# Patient Record
Sex: Male | Born: 1950 | Race: Black or African American | Hispanic: No | Marital: Married | Smoking: Never smoker
Health system: Southern US, Community
[De-identification: ages and names within clinical notes are randomized; demographics above are authoritative.]

## PROBLEM LIST (undated history)

## (undated) DIAGNOSIS — M1990 Unspecified osteoarthritis, unspecified site: Secondary | ICD-10-CM

## (undated) DIAGNOSIS — I10 Essential (primary) hypertension: Secondary | ICD-10-CM

## (undated) DIAGNOSIS — R519 Headache, unspecified: Secondary | ICD-10-CM

## (undated) HISTORY — PX: TOE SURGERY: SHX1073

---

## 2002-08-24 ENCOUNTER — Encounter: Payer: Self-pay | Admitting: Family Medicine

## 2002-08-24 ENCOUNTER — Encounter: Admission: RE | Admit: 2002-08-24 | Discharge: 2002-08-24 | Payer: Self-pay | Admitting: Family Medicine

## 2017-01-23 ENCOUNTER — Other Ambulatory Visit: Payer: Self-pay | Admitting: Family Medicine

## 2017-01-23 DIAGNOSIS — R748 Abnormal levels of other serum enzymes: Secondary | ICD-10-CM

## 2017-02-07 ENCOUNTER — Ambulatory Visit
Admission: RE | Admit: 2017-02-07 | Discharge: 2017-02-07 | Disposition: A | Discharge status: Home / Self Care | Payer: Medicare Other | Source: Ambulatory Visit | Attending: Family Medicine | Admitting: Family Medicine

## 2017-02-07 DIAGNOSIS — R748 Abnormal levels of other serum enzymes: Secondary | ICD-10-CM

## 2017-02-07 IMAGING — US US ABDOMEN COMPLETE
1 series · 14 of 25 positions shown · non-contrast
Comparison: None in PACs

CLINICAL DATA: Elevated liver function studies

EXAM:
ABDOMEN ULTRASOUND COMPLETE

[Series 1: us abdomen complete · 0.17mm/px · 14 of 90 slices shown]
[im 1/90]
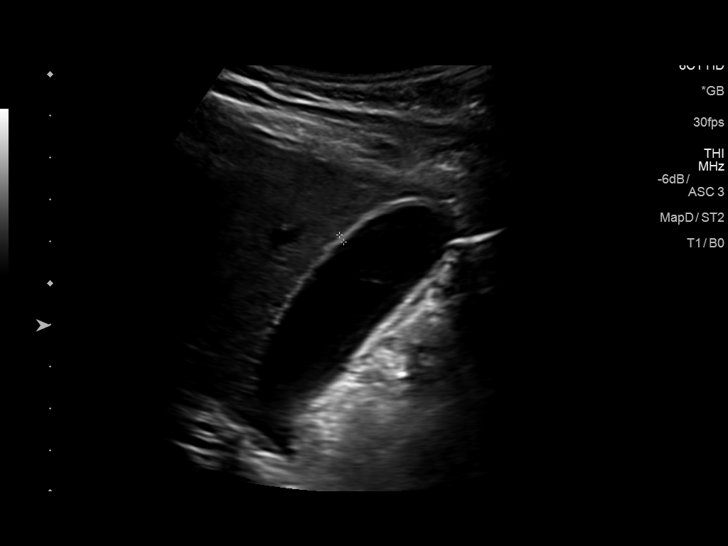
[im 8/90]
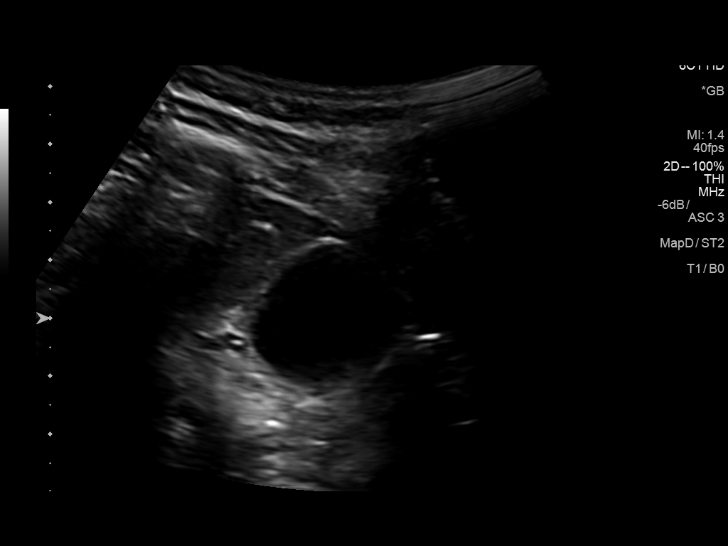
[im 15/90]
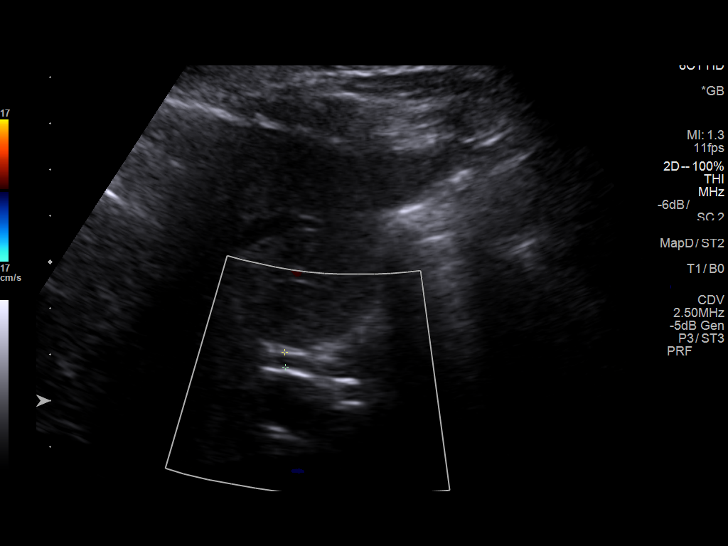
[im 23/90]
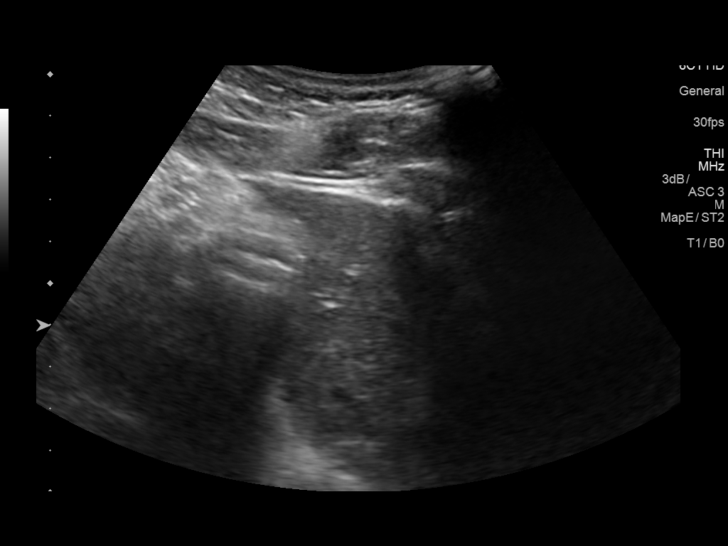
[im 30/90]
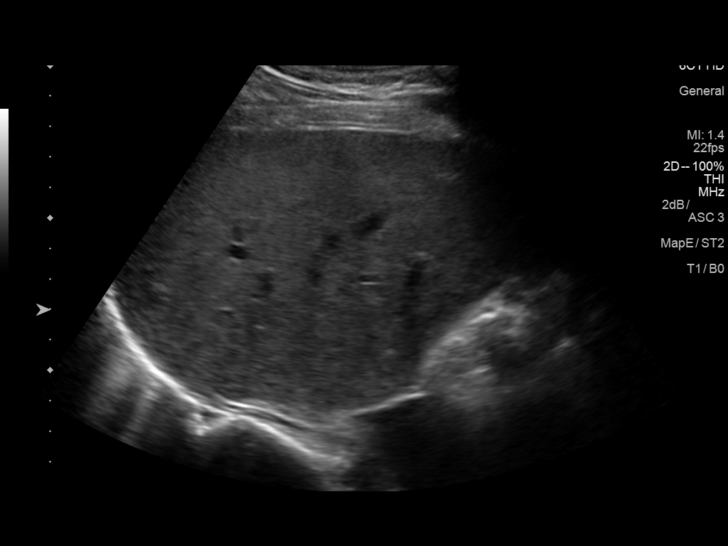
[im 34/90]
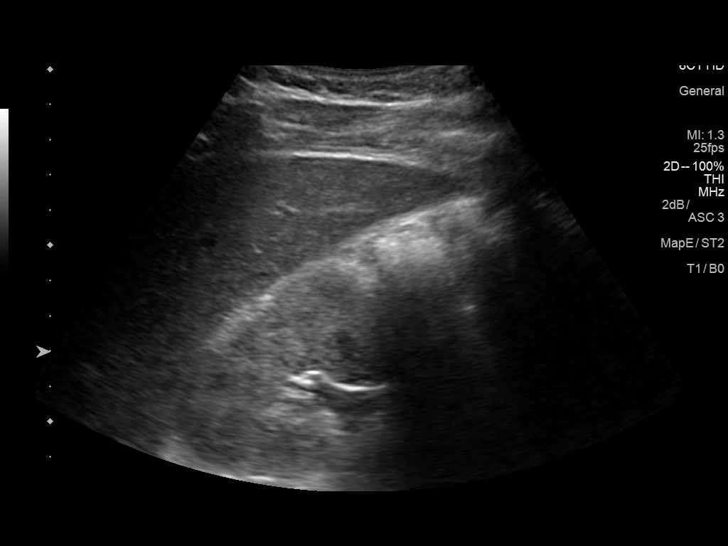
[im 41/90]
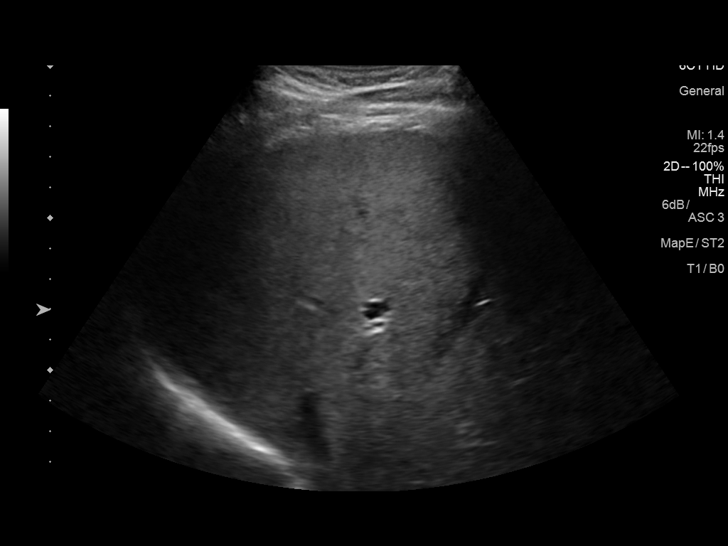
[im 49/90]
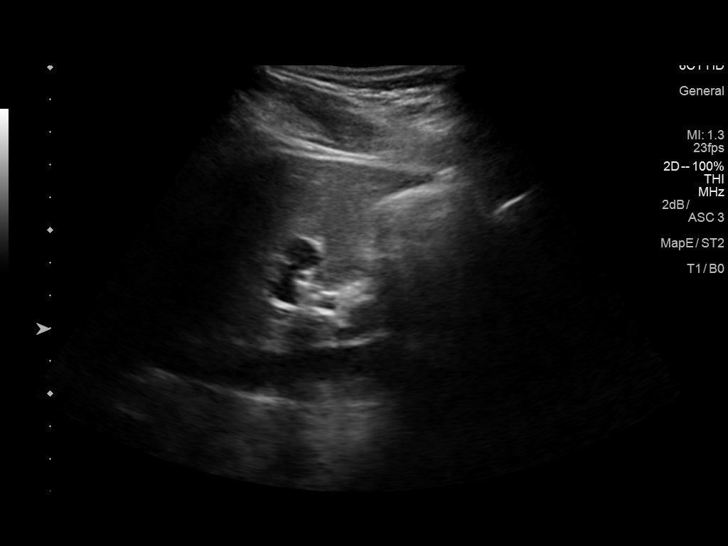
[im 56/90]
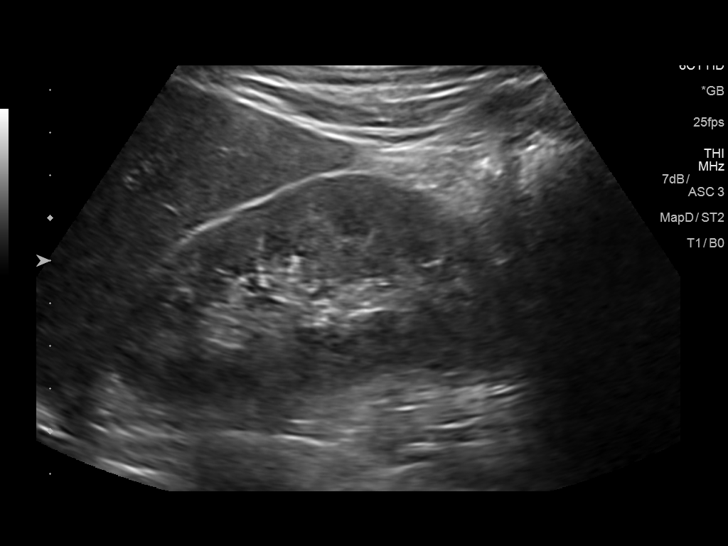
[im 60/90]
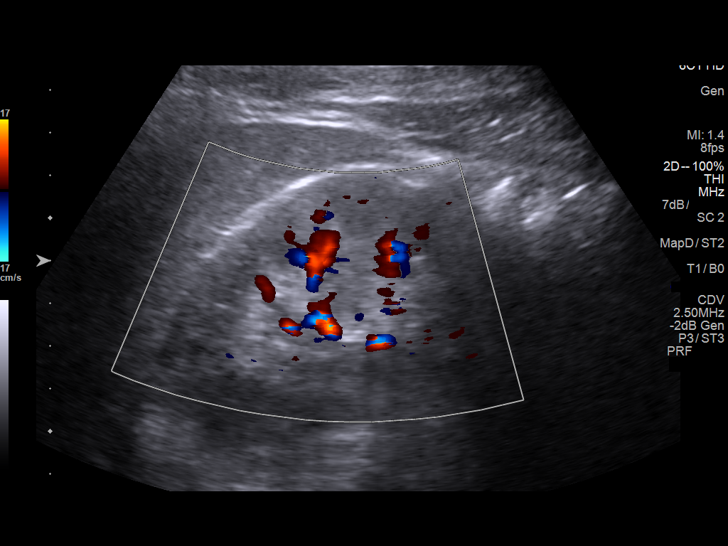
[im 67/90]
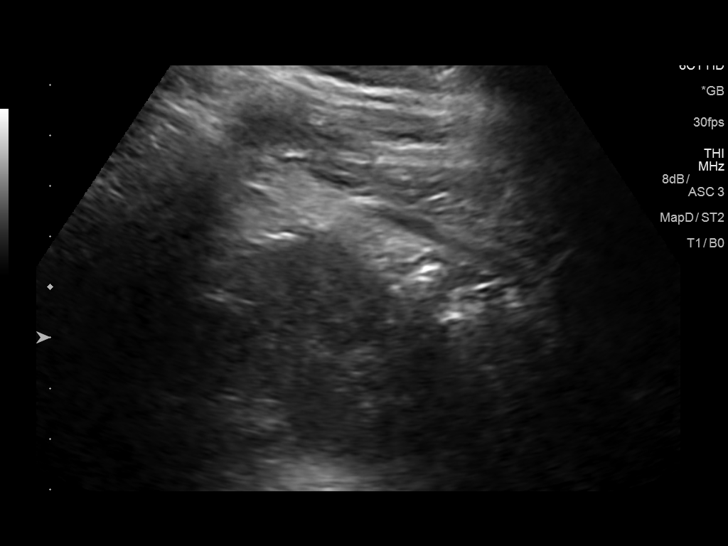
[im 75/90]
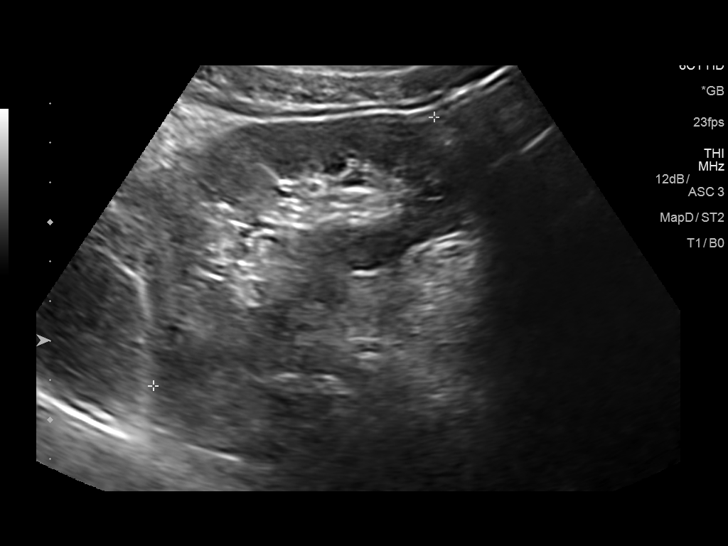
[im 82/90]
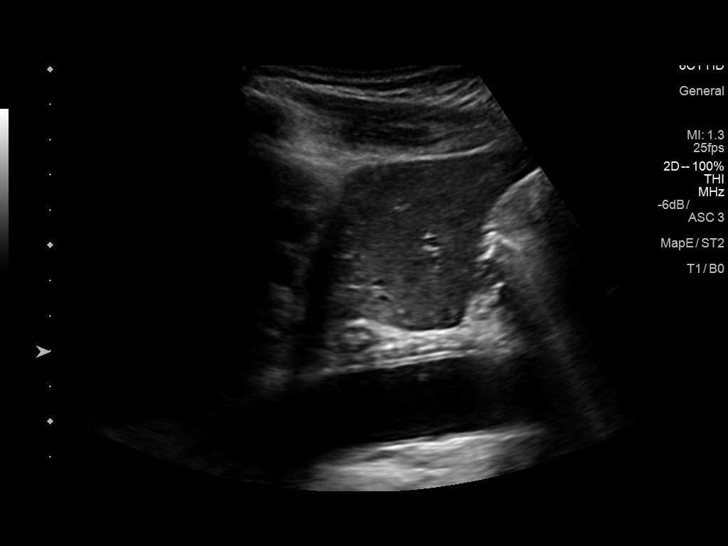
[im 90/90]
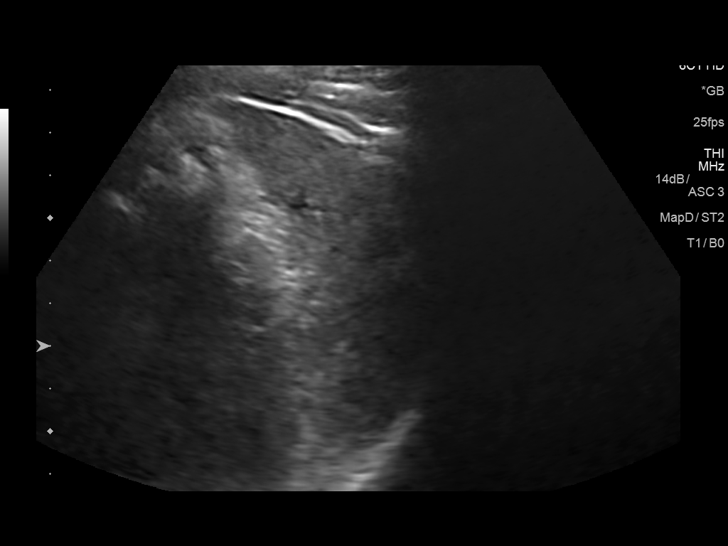

[14 of 25 positions shown; findings below may reference images not displayed]

FINDINGS: Gallbladder: No gallstones or wall thickening visualized. No
sonographic Murphy sign noted by sonographer.

Common bile duct: Diameter: 3.2 mm

Liver: No focal lesion identified. Within normal limits in
parenchymal echogenicity.

IVC: No abnormality visualized.

Pancreas: The pancreas is obscured by bowel gas.

Spleen: Size and appearance within normal limits.

Right Kidney: Length: 10.8 cm. Echogenicity within normal limits. No
mass or hydronephrosis visualized.

Left Kidney: Length: 10 cm. Echogenicity within normal limits. No
mass or hydronephrosis visualized.

Abdominal aorta: No aneurysm visualized.

Other findings: There is no ascites.
IMPRESSION: Normal appearance of the gallbladder, common bile duct, and liver.
Nonvisualization of the pancreas.

No acute intra-abdominal abnormality is observed.

## 2020-01-07 ENCOUNTER — Ambulatory Visit: Payer: Medicare Other | Attending: Internal Medicine

## 2020-01-07 DIAGNOSIS — Z23 Encounter for immunization: Secondary | ICD-10-CM | POA: Insufficient documentation

## 2020-01-07 NOTE — Progress Notes (Signed)
   Covid-19 Vaccination Clinic  Name:  Cristian Buck    MRN: [id number] DOB: May 16, 1951  01/07/2020  [name] was observed post Covid-19 immunization for 15 minutes without incidence. He was provided with Vaccine Information Sheet and instruction to access the V-Safe system.   [name] was instructed to call 911 with any severe reactions post vaccine: Marland Kitchen Difficulty breathing  . Swelling of your face and throat  . A fast heartbeat  . A bad rash all over your body  . Dizziness and weakness    Immunizations Administered    Name Date Dose VIS Date Route   Pfizer COVID-19 Vaccine 01/07/2020  9:16 AM 0.3 mL 10/23/2019 Intramuscular   Manufacturer: [employer], [employer]   Lot: [id number]   NDC: [address]-4

## 2020-02-02 ENCOUNTER — Ambulatory Visit: Payer: Medicare Other | Attending: Internal Medicine

## 2020-02-02 DIAGNOSIS — Z23 Encounter for immunization: Secondary | ICD-10-CM

## 2020-02-02 NOTE — Progress Notes (Signed)
   Covid-19 Vaccination Clinic  Name:  Cristian Buck    MRN: [id number] DOB: 1951-05-30  02/02/2020  Cristian Buck was observed post Covid-19 immunization for 15 minutes without incident. He was provided with Vaccine Information Sheet and instruction to access the V-Safe system.   Cristian Buck was instructed to call 911 with any severe reactions post vaccine: Marland Kitchen Difficulty breathing  . Swelling of face and throat  . A fast heartbeat  . A bad rash all over body  . Dizziness and weakness   Immunizations Administered    Name Date Dose VIS Date Route   Pfizer COVID-19 Vaccine 02/02/2020  8:36 AM 0.3 mL 10/23/2019 Intramuscular   Manufacturer: [employer], [employer]   Lot: [id number]   NDC: [address]-8

## 2021-03-06 ENCOUNTER — Other Ambulatory Visit: Payer: Self-pay | Admitting: Family Medicine

## 2021-03-06 DIAGNOSIS — I739 Peripheral vascular disease, unspecified: Secondary | ICD-10-CM

## 2021-03-14 ENCOUNTER — Ambulatory Visit
Admission: RE | Admit: 2021-03-14 | Discharge: 2021-03-14 | Disposition: A | Discharge status: Home / Self Care | Payer: Medicare Other | Source: Ambulatory Visit | Attending: Family Medicine | Admitting: Family Medicine

## 2021-03-14 DIAGNOSIS — I739 Peripheral vascular disease, unspecified: Secondary | ICD-10-CM

## 2022-08-17 ENCOUNTER — Other Ambulatory Visit: Payer: Self-pay | Admitting: Orthopaedic Surgery

## 2022-08-17 DIAGNOSIS — M545 Low back pain, unspecified: Secondary | ICD-10-CM

## 2022-08-17 DIAGNOSIS — M5116 Intervertebral disc disorders with radiculopathy, lumbar region: Secondary | ICD-10-CM

## 2022-08-17 DIAGNOSIS — G894 Chronic pain syndrome: Secondary | ICD-10-CM

## 2022-09-13 ENCOUNTER — Ambulatory Visit
Admission: RE | Admit: 2022-09-13 | Discharge: 2022-09-13 | Disposition: A | Discharge status: Home / Self Care | Payer: Medicare Other | Source: Ambulatory Visit | Attending: Orthopaedic Surgery | Admitting: Orthopaedic Surgery

## 2022-09-13 DIAGNOSIS — M5116 Intervertebral disc disorders with radiculopathy, lumbar region: Secondary | ICD-10-CM

## 2022-09-13 DIAGNOSIS — G894 Chronic pain syndrome: Secondary | ICD-10-CM

## 2022-09-13 DIAGNOSIS — M545 Low back pain, unspecified: Secondary | ICD-10-CM

## 2022-09-18 HISTORY — PX: COLONOSCOPY: SHX174

## 2022-10-16 ENCOUNTER — Other Ambulatory Visit: Payer: Self-pay | Admitting: Neurosurgery

## 2022-10-19 NOTE — Pre-Procedure Instructions (Signed)
Surgical Instructions    Your procedure is scheduled on Wednesday, October 24, 2022.  Report to [facility] Main Entrance "A" at 1:30  P.M., then check in with the Admitting office.  Call this number if you have problems the morning of surgery:  [phone]   If you have any questions prior to your surgery date call [phone]: Open Monday-Friday 8am-4pm If you experience any cold or flu symptoms such as cough, fever, chills, shortness of breath, etc. between now and your scheduled surgery, please notify us at the above number     Remember:  Do not eat after midnight the night before your surgery  You may drink clear liquids until 12:30 PM the day of your surgery.   Clear liquids allowed are: Water, Non-Citrus Juices (without pulp), Carbonated Beverages, Clear Tea, Black Coffee ONLY (NO MILK, CREAM OR POWDERED CREAMER of any kind), and Gatorade    Take these medicines the morning of surgery with A SIP OF WATER:   amLODipine (NORVASC)  rosuvastatin (CRESTOR)   As of today, STOP taking any Aspirin (unless otherwise instructed by your surgeon) Aleve, Naproxen, Ibuprofen, Motrin, Advil, Goody's, BC's, all herbal medications, fish oil, and all vitamins.  Follow your surgeon's instructions on when to stop Aspirin.  If no instructions were given by your surgeon then you will need to call the office to get those instructions.       Do not wear jewelry. Do not wear lotions, powders, cologne or deodorant. Do not shave 48 hours prior to surgery.  Men may shave face and neck. Do not bring valuables to the hospital. Do not wear nail polish  [place] is not responsible for any belongings or valuables.    Do NOT Smoke (Tobacco/Vaping)  24 hours prior to your procedure  If you use a CPAP at night, you may bring your mask for your overnight stay.   Contacts, glasses, hearing aids, dentures or partials may not be worn into surgery, please bring cases for these belongings   For patients  admitted to the hospital, discharge time will be determined by your treatment team.   Patients discharged the day of surgery will not be allowed to drive home, and someone needs to stay with them for 24 hours.   SURGICAL WAITING ROOM VISITATION Patients having surgery or a procedure may have no more than 2 support people in the waiting area - these visitors may rotate.   Children under the age of 43 must have an adult with them who is not the patient. If the patient needs to stay at the hospital during part of their recovery, the visitor guidelines for inpatient rooms apply. Pre-op nurse will coordinate an appropriate time for 1 support person to accompany patient in pre-op.  This support person may not rotate.   Please refer to [email or web address] for the visitor guidelines for Inpatients (after your surgery is over and you are in a regular room).    Special instructions:    Oral Hygiene is also important to reduce your risk of infection.  Remember - BRUSH YOUR TEETH THE MORNING OF SURGERY WITH YOUR REGULAR TOOTHPASTE   [facility]- Preparing For Surgery  Before surgery, you can play an important role. Because skin is not sterile, your skin needs to be as free of germs as possible. You can reduce the number of germs on your skin by washing with CHG (chlorahexidine gluconate) Soap before surgery.  CHG is an antiseptic cleaner which kills germs and bonds with the  skin to continue killing germs even after washing.     Please do not use if you have an allergy to CHG or antibacterial soaps. If your skin becomes reddened/irritated stop using the CHG.  Do not shave (including legs and underarms) for at least 48 hours prior to first CHG shower. It is OK to shave your face.  Please follow these instructions carefully.     Shower the NIGHT BEFORE SURGERY and the MORNING OF SURGERY with CHG Soap.   If you chose to wash your hair, wash your hair  first as usual with your normal shampoo. After you shampoo, rinse your hair and body thoroughly to remove the shampoo.  Then [employer] and genitals (private parts) with your normal soap and rinse thoroughly to remove soap.  After that Use CHG Soap as you would any other liquid soap. You can apply CHG directly to the skin and wash gently with a scrungie or a clean washcloth.   Apply the CHG Soap to your body ONLY FROM THE NECK DOWN.  Do not use on open wounds or open sores. Avoid contact with your eyes, ears, mouth and genitals (private parts). Wash Face and genitals (private parts)  with your normal soap.   Wash thoroughly, paying special attention to the area where your surgery will be performed.  Thoroughly rinse your body with warm water from the neck down.  DO NOT shower/wash with your normal soap after using and rinsing off the CHG Soap.  Pat yourself dry with a CLEAN TOWEL.  Wear CLEAN PAJAMAS to bed the night before surgery  Place CLEAN SHEETS on your bed the night before your surgery  DO NOT SLEEP WITH PETS.   Day of Surgery:  Take a shower with CHG soap. Wear Clean/Comfortable clothing the morning of surgery Do not apply any deodorants/lotions.   Remember to brush your teeth WITH YOUR REGULAR TOOTHPASTE.    If you received a COVID test during your pre-op visit, it is requested that you wear a mask when out in public, stay away from anyone that may not be feeling well, and notify your surgeon if you develop symptoms. If you have been in contact with anyone that has tested positive in the last 10 days, please notify your surgeon.    Please read over the following fact sheets that you were given.

## 2022-10-22 ENCOUNTER — Other Ambulatory Visit: Payer: Self-pay

## 2022-10-22 ENCOUNTER — Encounter (HOSPITAL_COMMUNITY): Payer: Self-pay

## 2022-10-22 ENCOUNTER — Encounter (HOSPITAL_COMMUNITY)
Admission: RE | Admit: 2022-10-22 | Discharge: 2022-10-22 | Disposition: A | Discharge status: Home / Self Care | Payer: Medicare Other | Source: Ambulatory Visit | Attending: Neurosurgery | Admitting: Neurosurgery

## 2022-10-22 VITALS — BP 145/83 | HR 81 | Temp 97.5°F | Resp 18 | Ht 72.0 in | Wt 169.4 lb

## 2022-10-22 DIAGNOSIS — Z01818 Encounter for other preprocedural examination: Secondary | ICD-10-CM | POA: Diagnosis not present

## 2022-10-22 DIAGNOSIS — I10 Essential (primary) hypertension: Secondary | ICD-10-CM | POA: Diagnosis not present

## 2022-10-22 HISTORY — DX: Headache, unspecified: R51.9

## 2022-10-22 HISTORY — DX: Essential (primary) hypertension: I10

## 2022-10-22 LAB — CBC
HCT: 41.1 % (ref 39.0–52.0)
Hemoglobin: 14.2 g/dL (ref 13.0–17.0)
MCH: 30.9 pg (ref 26.0–34.0)
MCHC: 34.5 g/dL (ref 30.0–36.0)
MCV: 89.5 fL (ref 80.0–100.0)
Platelets: 284 10*3/uL (ref 150–400)
RBC: 4.59 MIL/uL (ref 4.22–5.81)
RDW: 12.3 % (ref 11.5–15.5)
WBC: 5.2 10*3/uL (ref 4.0–10.5)
nRBC: 0 % (ref 0.0–0.2)

## 2022-10-22 LAB — BASIC METABOLIC PANEL
Anion gap: 7 (ref 5–15)
BUN: 6 mg/dL — ABNORMAL LOW (ref 8–23)
CO2: 28 mmol/L (ref 22–32)
Calcium: 9.3 mg/dL (ref 8.9–10.3)
Chloride: 103 mmol/L (ref 98–111)
Creatinine, Ser: 1 mg/dL (ref 0.61–1.24)
GFR, Estimated: >60 mL/min (ref >60)
Glucose, Bld: 95 mg/dL (ref 70–99)
Potassium: 3.7 mmol/L (ref 3.5–5.1)
Sodium: 138 mmol/L (ref 135–145)

## 2022-10-22 LAB — SURGICAL PCR SCREEN
MRSA, PCR: NEGATIVE
Staphylococcus aureus: NEGATIVE

## 2022-10-22 NOTE — Pre-Procedure Instructions (Signed)
Surgical Instructions    Your procedure is scheduled on Wednesday, October 24, 2022.  Report to [facility] Main Entrance "A" at 1:30  P.M., then check in with the Admitting office.  Call this number if you have problems the morning of surgery:  [phone]   If you have any questions prior to your surgery date call [phone]: Open Monday-Friday 8am-4pm If you experience any cold or flu symptoms such as cough, fever, chills, shortness of breath, etc. between now and your scheduled surgery, please notify us at the above number     Remember:  Do not eat or drink after midnight the night before your surgery     Take these medicines the morning of surgery with A SIP OF WATER:   amLODipine (NORVASC)  rosuvastatin (CRESTOR)   As of today, STOP taking any (unless otherwise instructed by your surgeon) Aleve, Naproxen, Ibuprofen, Motrin, Advil, Goody's, BC's, all herbal medications, fish oil, and all vitamins.  Follow your surgeon's instructions on when to stop Aspirin.  If no instructions were given by your surgeon then you will need to call the office to get those instructions.       [place] is not responsible for any belongings or valuables.    Do NOT Smoke (Tobacco/Vaping)  24 hours prior to your procedure  If you use a CPAP at night, you may bring your mask for your overnight stay.   Contacts, glasses, hearing aids, dentures or partials may not be worn into surgery, please bring cases for these belongings   For patients admitted to the hospital, discharge time will be determined by your treatment team.   Patients discharged the day of surgery will not be allowed to drive home, and someone needs to stay with them for 24 hours.   SURGICAL WAITING ROOM VISITATION Patients having surgery or a procedure may have no more than 2 support people in the waiting area - these visitors may rotate.   Children under the age of 27 must have an adult with them who is not the patient. If  the patient needs to stay at the hospital during part of their recovery, the visitor guidelines for inpatient rooms apply. Pre-op nurse will coordinate an appropriate time for 1 support person to accompany patient in pre-op.  This support person may not rotate.   Please refer to [email or web address] for the visitor guidelines for Inpatients (after your surgery is over and you are in a regular room).    Special instructions:    Oral Hygiene is also important to reduce your risk of infection.  Remember - BRUSH YOUR TEETH THE MORNING OF SURGERY WITH YOUR REGULAR TOOTHPASTE   [place]- Preparing For Surgery  Before surgery, you can play an important role. Because skin is not sterile, your skin needs to be as free of germs as possible. You can reduce the number of germs on your skin by washing with CHG (chlorahexidine gluconate) Soap before surgery.  CHG is an antiseptic cleaner which kills germs and bonds with the skin to continue killing germs even after washing.     Please do not use if you have an allergy to CHG or antibacterial soaps. If your skin becomes reddened/irritated stop using the CHG.  Do not shave (including legs and underarms) for at least 48 hours prior to first CHG shower. It is OK to shave your face.  Please follow these instructions carefully.     Shower the NIGHT BEFORE SURGERY and the MORNING OF SURGERY with  CHG Soap.   If you chose to wash your hair, wash your hair first as usual with your normal shampoo. After you shampoo, rinse your hair and body thoroughly to remove the shampoo.  Then [employer] and genitals (private parts) with your normal soap and rinse thoroughly to remove soap.  After that Use CHG Soap as you would any other liquid soap. You can apply CHG directly to the skin and wash gently with a scrungie or a clean washcloth.   Apply the CHG Soap to your body ONLY FROM THE NECK DOWN.  Do not use on open wounds  or open sores. Avoid contact with your eyes, ears, mouth and genitals (private parts). Wash Face and genitals (private parts)  with your normal soap.   Wash thoroughly, paying special attention to the area where your surgery will be performed.  Thoroughly rinse your body with warm water from the neck down.  DO NOT shower/wash with your normal soap after using and rinsing off the CHG Soap.  Pat yourself dry with a CLEAN TOWEL.  Wear CLEAN PAJAMAS to bed the night before surgery  Place CLEAN SHEETS on your bed the night before your surgery  DO NOT SLEEP WITH PETS.   Day of Surgery:  Take a shower with CHG soap. Wear Clean/Comfortable clothing the morning of surgery Do not wear jewelry. Do not wear lotions, powders, cologne or deodorant. Do not shave 48 hours prior to surgery.  Men may shave face and neck. Do not bring valuables to the hospital. Do not wear nail polish  Remember to brush your teeth WITH YOUR REGULAR TOOTHPASTE.    If you received a COVID test during your pre-op visit, it is requested that you wear a mask when out in public, stay away from anyone that may not be feeling well, and notify your surgeon if you develop symptoms. If you have been in contact with anyone that has tested positive in the last 10 days, please notify your surgeon.    Please read over the following fact sheets that you were given.

## 2022-10-22 NOTE — Progress Notes (Signed)
[facility] Cardi[facility] - denies  PPM/ICD - denies  Chest x-ray - N/A EKG - 10/22/2022  Stress Test - pt reports normal stress test "years ago" when he was playing sports.  ECHO - denies Cardiac Cath - denies  Sleep Study - denies  Fasting Blood Sugar - N/A  Last dose of GLP1 agonist-  N/A  Blood Thinner Instructions: N/A Aspirin Instructions: Last dose of Aspirin 10/12/22  ERAS Protcol - NPO   COVID TEST- N/A  Erie Noe with Dr. Lonie Peak office notified that pre-op orders need to be placed.   Anesthesia review: no  Patient denies shortness of breath, fever, cough and chest pain at PAT appointment   All instructions explained to the patient, with a verbal understanding of the material. Patient agrees to go over the instructions while at home for a better understanding. The opportunity to ask questions was provided.

## 2022-10-24 ENCOUNTER — Other Ambulatory Visit: Payer: Self-pay

## 2022-10-24 ENCOUNTER — Encounter (HOSPITAL_COMMUNITY): Payer: Self-pay | Admitting: Neurosurgery

## 2022-10-24 ENCOUNTER — Ambulatory Visit (HOSPITAL_COMMUNITY): Payer: Medicare Other | Admitting: Anesthesiology

## 2022-10-24 ENCOUNTER — Ambulatory Visit (HOSPITAL_BASED_OUTPATIENT_CLINIC_OR_DEPARTMENT_OTHER): Payer: Medicare Other | Admitting: Anesthesiology

## 2022-10-24 ENCOUNTER — Ambulatory Visit (HOSPITAL_COMMUNITY): Payer: Medicare Other

## 2022-10-24 ENCOUNTER — Encounter (HOSPITAL_COMMUNITY)
Admission: RE | Disposition: A | Discharge status: Home / Self Care | Payer: Self-pay | Source: Ambulatory Visit | Attending: Neurosurgery

## 2022-10-24 ENCOUNTER — Ambulatory Visit (HOSPITAL_COMMUNITY)
Admission: RE | Admit: 2022-10-24 | Discharge: 2022-10-25 | Disposition: A | Discharge status: Home / Self Care | Payer: Medicare Other | Source: Ambulatory Visit | Attending: Neurosurgery | Admitting: Neurosurgery

## 2022-10-24 DIAGNOSIS — M48062 Spinal stenosis, lumbar region with neurogenic claudication: Secondary | ICD-10-CM | POA: Diagnosis present

## 2022-10-24 DIAGNOSIS — Z79899 Other long term (current) drug therapy: Secondary | ICD-10-CM | POA: Insufficient documentation

## 2022-10-24 DIAGNOSIS — M5416 Radiculopathy, lumbar region: Secondary | ICD-10-CM | POA: Diagnosis not present

## 2022-10-24 DIAGNOSIS — I10 Essential (primary) hypertension: Secondary | ICD-10-CM | POA: Insufficient documentation

## 2022-10-24 DIAGNOSIS — M48061 Spinal stenosis, lumbar region without neurogenic claudication: Secondary | ICD-10-CM

## 2022-10-24 HISTORY — PX: LUMBAR LAMINECTOMY/DECOMPRESSION MICRODISCECTOMY: SHX5026

## 2022-10-24 SURGERY — LUMBAR LAMINECTOMY/DECOMPRESSION MICRODISCECTOMY 3 LEVELS
Anesthesia: General | Site: Spine Lumbar

## 2022-10-24 MED ORDER — LATANOPROST 0.005 % OP SOLN
1.0000 [drp] | Freq: Every day | OPHTHALMIC | Status: DC
  Administered 2022-10-24: 1 [drp] via OPHTHALMIC
  Filled 2022-10-24: qty 2.5

## 2022-10-24 MED ORDER — ROCURONIUM BROMIDE 10 MG/ML (PF) SYRINGE
PREFILLED_SYRINGE | INTRAVENOUS | Status: DC | PRN
  Administered 2022-10-24: 70 mg via INTRAVENOUS
  Administered 2022-10-24: 20 mg via INTRAVENOUS

## 2022-10-24 MED ORDER — SUGAMMADEX SODIUM 200 MG/2ML IV SOLN
INTRAVENOUS | Status: DC | PRN
  Administered 2022-10-24: 200 mg via INTRAVENOUS

## 2022-10-24 MED ORDER — PROPOFOL 10 MG/ML IV BOLUS
INTRAVENOUS | Status: AC
  Filled 2022-10-24: qty 20

## 2022-10-24 MED ORDER — PHENYLEPHRINE 80 MCG/ML (10ML) SYRINGE FOR IV PUSH (FOR BLOOD PRESSURE SUPPORT)
PREFILLED_SYRINGE | INTRAVENOUS | Status: DC | PRN
  Administered 2022-10-24 (×4): 80 ug via INTRAVENOUS
  Administered 2022-10-24: 160 ug via INTRAVENOUS
  Administered 2022-10-24 (×2): 80 ug via INTRAVENOUS

## 2022-10-24 MED ORDER — ASPIRIN 81 MG PO TBEC
81.0000 mg | DELAYED_RELEASE_TABLET | Freq: Every day | ORAL | Status: DC
  Administered 2022-10-25: 81 mg via ORAL
  Filled 2022-10-24: qty 1

## 2022-10-24 MED ORDER — LIDOCAINE 2% (20 MG/ML) 5 ML SYRINGE
INTRAMUSCULAR | Status: DC | PRN
  Administered 2022-10-24: 60 mg via INTRAVENOUS

## 2022-10-24 MED ORDER — ALUM & MAG HYDROXIDE-SIMETH 200-200-20 MG/5ML PO SUSP: 30.0000 mL | Freq: Four times a day (QID) | ORAL | Status: DC | PRN

## 2022-10-24 MED ORDER — 0.9 % SODIUM CHLORIDE (POUR BTL) OPTIME
TOPICAL | Status: DC | PRN
  Administered 2022-10-24: 1000 mL

## 2022-10-24 MED ORDER — CYCLOBENZAPRINE HCL 10 MG PO TABS
10.0000 mg | ORAL_TABLET | Freq: Three times a day (TID) | ORAL | Status: DC | PRN
  Filled 2022-10-24: qty 1

## 2022-10-24 MED ORDER — ONDANSETRON HCL 4 MG/2ML IJ SOLN
INTRAMUSCULAR | Status: DC | PRN
  Administered 2022-10-24: 4 mg via INTRAVENOUS

## 2022-10-24 MED ORDER — ADULT MULTIVITAMIN W/MINERALS CH
1.0000 | ORAL_TABLET | Freq: Every day | ORAL | Status: DC
  Administered 2022-10-25: 1 via ORAL
  Filled 2022-10-24: qty 1

## 2022-10-24 MED ORDER — CHLORHEXIDINE GLUCONATE 0.12 % MT SOLN
15.0000 mL | Freq: Once | OROMUCOSAL | Status: AC
  Administered 2022-10-24: 15 mL via OROMUCOSAL
  Filled 2022-10-24: qty 15

## 2022-10-24 MED ORDER — SODIUM CHLORIDE 0.9 % IV SOLN
250.0000 mL | INTRAVENOUS | Status: DC
  Administered 2022-10-24: 250 mL via INTRAVENOUS

## 2022-10-24 MED ORDER — SODIUM CHLORIDE 0.9% FLUSH: 3.0000 mL | INTRAVENOUS | Status: DC | PRN

## 2022-10-24 MED ORDER — LIDOCAINE-EPINEPHRINE 1 %-1:100000 IJ SOLN
INTRAMUSCULAR | Status: AC
  Filled 2022-10-24: qty 1

## 2022-10-24 MED ORDER — TELMISARTAN-HCTZ 80-12.5 MG PO TABS: 1.0000 | ORAL_TABLET | Freq: Every morning | ORAL | Status: DC

## 2022-10-24 MED ORDER — OXYCODONE HCL 5 MG/5ML PO SOLN: 5.0000 mg | Freq: Once | ORAL | Status: DC | PRN

## 2022-10-24 MED ORDER — FENTANYL CITRATE (PF) 250 MCG/5ML IJ SOLN
INTRAMUSCULAR | Status: AC
  Filled 2022-10-24: qty 5

## 2022-10-24 MED ORDER — CINNAMON 500 MG PO TABS: 500.0000 mg | ORAL_TABLET | Freq: Every day | ORAL | Status: DC

## 2022-10-24 MED ORDER — PHENYLEPHRINE HCL-NACL 20-0.9 MG/250ML-% IV SOLN
INTRAVENOUS | Status: DC | PRN
  Administered 2022-10-24: 40 ug/min via INTRAVENOUS

## 2022-10-24 MED ORDER — PANTOPRAZOLE SODIUM 40 MG IV SOLR
40.0000 mg | Freq: Every day | INTRAVENOUS | Status: DC
  Administered 2022-10-24: 40 mg via INTRAVENOUS
  Filled 2022-10-24: qty 10

## 2022-10-24 MED ORDER — PHENOL 1.4 % MT LIQD: 1.0000 | OROMUCOSAL | Status: DC | PRN

## 2022-10-24 MED ORDER — ROSUVASTATIN CALCIUM 5 MG PO TABS
10.0000 mg | ORAL_TABLET | Freq: Every morning | ORAL | Status: DC
  Administered 2022-10-25: 10 mg via ORAL
  Filled 2022-10-24: qty 2

## 2022-10-24 MED ORDER — AMISULPRIDE (ANTIEMETIC) 5 MG/2ML IV SOLN: 10.0000 mg | Freq: Once | INTRAVENOUS | Status: DC | PRN

## 2022-10-24 MED ORDER — THROMBIN 20000 UNITS EX SOLR: CUTANEOUS | Status: DC | PRN

## 2022-10-24 MED ORDER — ACETAMINOPHEN 500 MG PO TABS
1000.0000 mg | ORAL_TABLET | Freq: Once | ORAL | Status: AC
  Administered 2022-10-24: 1000 mg via ORAL
  Filled 2022-10-24: qty 2

## 2022-10-24 MED ORDER — OXYCODONE HCL 5 MG PO TABS: 5.0000 mg | ORAL_TABLET | Freq: Once | ORAL | Status: DC | PRN

## 2022-10-24 MED ORDER — PROPOFOL 10 MG/ML IV BOLUS
INTRAVENOUS | Status: DC | PRN
  Administered 2022-10-24: 130 mg via INTRAVENOUS

## 2022-10-24 MED ORDER — TURMERIC CURCUMIN PO CAPS: ORAL_CAPSULE | Freq: Every day | ORAL | Status: DC

## 2022-10-24 MED ORDER — ACETAMINOPHEN 650 MG RE SUPP: 650.0000 mg | RECTAL | Status: DC | PRN

## 2022-10-24 MED ORDER — CEFAZOLIN SODIUM-DEXTROSE 2-4 GM/100ML-% IV SOLN
2.0000 g | Freq: Three times a day (TID) | INTRAVENOUS | Status: AC
  Administered 2022-10-24 – 2022-10-25 (×2): 2 g via INTRAVENOUS
  Filled 2022-10-24 (×2): qty 100

## 2022-10-24 MED ORDER — ORAL CARE MOUTH RINSE: 15.0000 mL | Freq: Once | OROMUCOSAL | Status: AC

## 2022-10-24 MED ORDER — CEFAZOLIN SODIUM-DEXTROSE 2-4 GM/100ML-% IV SOLN
2.0000 g | INTRAVENOUS | Status: AC
  Administered 2022-10-24: 2 g via INTRAVENOUS
  Filled 2022-10-24: qty 100

## 2022-10-24 MED ORDER — ONDANSETRON HCL 4 MG/2ML IJ SOLN: 4.0000 mg | Freq: Four times a day (QID) | INTRAMUSCULAR | Status: DC | PRN

## 2022-10-24 MED ORDER — THROMBIN 20000 UNITS EX SOLR
CUTANEOUS | Status: AC
  Filled 2022-10-24: qty 20000

## 2022-10-24 MED ORDER — CHLORHEXIDINE GLUCONATE CLOTH 2 % EX PADS: 6.0000 | MEDICATED_PAD | Freq: Once | CUTANEOUS | Status: DC

## 2022-10-24 MED ORDER — THROMBIN 5000 UNITS EX SOLR
CUTANEOUS | Status: AC
  Filled 2022-10-24: qty 5000

## 2022-10-24 MED ORDER — IRBESARTAN 300 MG PO TABS
300.0000 mg | ORAL_TABLET | Freq: Every day | ORAL | Status: DC
  Administered 2022-10-25: 300 mg via ORAL
  Filled 2022-10-24: qty 1

## 2022-10-24 MED ORDER — HYDROCODONE-ACETAMINOPHEN 5-325 MG PO TABS: 2.0000 | ORAL_TABLET | ORAL | Status: DC | PRN

## 2022-10-24 MED ORDER — THROMBIN 5000 UNITS EX SOLR: OROMUCOSAL | Status: DC | PRN

## 2022-10-24 MED ORDER — THROMBIN 20000 UNITS EX KIT
PACK | CUTANEOUS | Status: AC
  Filled 2022-10-24: qty 1

## 2022-10-24 MED ORDER — HYDROCHLOROTHIAZIDE 12.5 MG PO TABS
12.5000 mg | ORAL_TABLET | Freq: Every day | ORAL | Status: DC
  Administered 2022-10-25: 12.5 mg via ORAL
  Filled 2022-10-24: qty 1

## 2022-10-24 MED ORDER — FENTANYL CITRATE (PF) 250 MCG/5ML IJ SOLN
INTRAMUSCULAR | Status: DC | PRN
  Administered 2022-10-24: 150 ug via INTRAVENOUS
  Administered 2022-10-24 (×2): 50 ug via INTRAVENOUS

## 2022-10-24 MED ORDER — BUPIVACAINE HCL (PF) 0.25 % IJ SOLN
INTRAMUSCULAR | Status: DC | PRN
  Administered 2022-10-24: 10 mL

## 2022-10-24 MED ORDER — ONDANSETRON HCL 4 MG PO TABS: 4.0000 mg | ORAL_TABLET | Freq: Four times a day (QID) | ORAL | Status: DC | PRN

## 2022-10-24 MED ORDER — DEXAMETHASONE SODIUM PHOSPHATE 10 MG/ML IJ SOLN
INTRAMUSCULAR | Status: DC | PRN
  Administered 2022-10-24: 10 mg via INTRAVENOUS

## 2022-10-24 MED ORDER — LACTATED RINGERS IV SOLN: INTRAVENOUS | Status: DC

## 2022-10-24 MED ORDER — ONDANSETRON HCL 4 MG/2ML IJ SOLN: 4.0000 mg | Freq: Once | INTRAMUSCULAR | Status: DC | PRN

## 2022-10-24 MED ORDER — BUPIVACAINE HCL (PF) 0.25 % IJ SOLN
INTRAMUSCULAR | Status: AC
  Filled 2022-10-24: qty 30

## 2022-10-24 MED ORDER — HYDROMORPHONE HCL 1 MG/ML IJ SOLN: 0.5000 mg | INTRAMUSCULAR | Status: DC | PRN

## 2022-10-24 MED ORDER — SODIUM CHLORIDE 0.9% FLUSH
3.0000 mL | Freq: Two times a day (BID) | INTRAVENOUS | Status: DC
  Administered 2022-10-24 – 2022-10-25 (×2): 3 mL via INTRAVENOUS

## 2022-10-24 MED ORDER — HYDROMORPHONE HCL 1 MG/ML IJ SOLN: 0.2500 mg | INTRAMUSCULAR | Status: DC | PRN

## 2022-10-24 MED ORDER — LIDOCAINE-EPINEPHRINE 1 %-1:100000 IJ SOLN
INTRAMUSCULAR | Status: DC | PRN
  Administered 2022-10-24: 10 mL

## 2022-10-24 MED ORDER — AMLODIPINE BESYLATE 5 MG PO TABS
5.0000 mg | ORAL_TABLET | Freq: Every morning | ORAL | Status: DC
  Administered 2022-10-25: 5 mg via ORAL
  Filled 2022-10-24: qty 1

## 2022-10-24 MED ORDER — ACETAMINOPHEN 325 MG PO TABS
650.0000 mg | ORAL_TABLET | ORAL | Status: DC | PRN
  Administered 2022-10-25: 650 mg via ORAL
  Filled 2022-10-24: qty 2

## 2022-10-24 MED ORDER — MENTHOL 3 MG MT LOZG: 1.0000 | LOZENGE | OROMUCOSAL | Status: DC | PRN

## 2022-10-24 SURGICAL SUPPLY — 49 items
BAG COUNTER SPONGE SURGICOUNT (BAG) ×1 IMPLANT
BAND RUBBER #18 3X1/16 STRL (MISCELLANEOUS) ×2 IMPLANT
BENZOIN TINCTURE PRP APPL 2/3 (GAUZE/BANDAGES/DRESSINGS) ×1 IMPLANT
BLADE CLIPPER SURG (BLADE) IMPLANT
BLADE SURG 11 STRL SS (BLADE) ×1 IMPLANT
BUR CUTTER 7.0 ROUND (BURR) ×1 IMPLANT
BUR MATCHSTICK NEURO 3.0 LAGG (BURR) ×1 IMPLANT
CANISTER SUCT 3000ML PPV (MISCELLANEOUS) ×1 IMPLANT
DERMABOND ADVANCED .7 DNX12 (GAUZE/BANDAGES/DRESSINGS) ×1 IMPLANT
DRAPE HALF SHEET 40X57 (DRAPES) IMPLANT
DRAPE LAPAROTOMY 100X72X124 (DRAPES) ×1 IMPLANT
DRAPE MICROSCOPE SLANT 54X150 (MISCELLANEOUS) ×1 IMPLANT
DRAPE SURG 17X23 STRL (DRAPES) ×1 IMPLANT
DRSG OPSITE 4X5.5 SM (GAUZE/BANDAGES/DRESSINGS) IMPLANT
DRSG OPSITE POSTOP 4X8 (GAUZE/BANDAGES/DRESSINGS) IMPLANT
DURAPREP 26ML APPLICATOR (WOUND CARE) ×1 IMPLANT
ELECT REM PT RETURN 9FT ADLT (ELECTROSURGICAL) ×1
ELECTRODE REM PT RTRN 9FT ADLT (ELECTROSURGICAL) ×1 IMPLANT
EVACUATOR 1/8 PVC DRAIN (DRAIN) IMPLANT
GAUZE 4X4 16PLY ~~LOC~~+RFID DBL (SPONGE) IMPLANT
GAUZE SPONGE 4X4 12PLY STRL (GAUZE/BANDAGES/DRESSINGS) ×1 IMPLANT
GLOVE BIO SURGEON STRL SZ7 (GLOVE) IMPLANT
GLOVE BIO SURGEON STRL SZ8 (GLOVE) ×1 IMPLANT
GLOVE BIOGEL PI IND STRL 7.0 (GLOVE) IMPLANT
GLOVE ECLIPSE 7.5 STRL STRAW (GLOVE) IMPLANT
GLOVE EXAM NITRILE XL STR (GLOVE) IMPLANT
GLOVE INDICATOR 8.5 STRL (GLOVE) ×2 IMPLANT
GOWN STRL REUS W/ TWL LRG LVL3 (GOWN DISPOSABLE) ×1 IMPLANT
GOWN STRL REUS W/ TWL XL LVL3 (GOWN DISPOSABLE) ×2 IMPLANT
GOWN STRL REUS W/TWL 2XL LVL3 (GOWN DISPOSABLE) IMPLANT
GOWN STRL REUS W/TWL LRG LVL3 (GOWN DISPOSABLE) ×1
GOWN STRL REUS W/TWL XL LVL3 (GOWN DISPOSABLE) ×2
KIT BASIN OR (CUSTOM PROCEDURE TRAY) ×1 IMPLANT
KIT TURNOVER KIT B (KITS) ×1 IMPLANT
NDL SPNL 22GX3.5 QUINCKE BK (NEEDLE) ×1 IMPLANT
NEEDLE HYPO 22GX1.5 SAFETY (NEEDLE) ×1 IMPLANT
NEEDLE SPNL 22GX3.5 QUINCKE BK (NEEDLE) ×1 IMPLANT
NS IRRIG 1000ML POUR BTL (IV SOLUTION) ×1 IMPLANT
PACK LAMINECTOMY NEURO (CUSTOM PROCEDURE TRAY) ×1 IMPLANT
SPIKE FLUID TRANSFER (MISCELLANEOUS) ×1 IMPLANT
SPONGE SURGIFOAM ABS GEL SZ50 (HEMOSTASIS) ×1 IMPLANT
STRIP CLOSURE SKIN 1/2X4 (GAUZE/BANDAGES/DRESSINGS) ×1 IMPLANT
SUT VIC AB 0 CT1 18XCR BRD8 (SUTURE) ×1 IMPLANT
SUT VIC AB 0 CT1 8-18 (SUTURE) ×1
SUT VIC AB 2-0 CT1 18 (SUTURE) ×1 IMPLANT
SUT VIC AB 4-0 PS2 27 (SUTURE) ×1 IMPLANT
TOWEL GREEN STERILE (TOWEL DISPOSABLE) ×1 IMPLANT
TOWEL GREEN STERILE FF (TOWEL DISPOSABLE) ×1 IMPLANT
WATER STERILE IRR 1000ML POUR (IV SOLUTION) ×1 IMPLANT

## 2022-10-24 NOTE — H&P (Signed)
Cristian Buck is an 71 y.o. male.   Chief Complaint: Back and bilateral hip and leg pain HPI: 71 year old gentleman with longstanding back bilateral hip and leg pain rating down L4-L5 nerve root pattern as well as anterior quads workup revealed severe spinal stenosis with thecal sac compression L2-3 L3-4 and L4-5.  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  Past Medical History:  Diagnosis Date   Headache    tension headaches   Hypertension     Past Surgical History:  Procedure Laterality Date   COLONOSCOPY  09/18/2022   TOE SURGERY Right    right little toe- shaved bone    History reviewed. No pertinent family history. Social History:  reports that he has never smoked. He has never used smokeless tobacco. He reports that he does not drink alcohol and does not use drugs.  Allergies: No Known Allergies  Medications Prior to Admission  Medication Sig Dispense Refill   amLODipine (NORVASC) 5 MG tablet Take 5 mg by mouth in the morning.     Cinnamon 500 MG TABS Take 500 mg by mouth daily.     latanoprost (XALATAN) 0.005 % ophthalmic solution Place 1 drop into both eyes at bedtime.     Multiple Vitamin (MULTIVITAMIN WITH MINERALS) TABS tablet Take 1 tablet by mouth daily.     rosuvastatin (CRESTOR) 10 MG tablet Take 10 mg by mouth in the morning.     telmisartan-hydrochlorothiazide (MICARDIS HCT) 80-12.5 MG tablet Take 1 tablet by mouth in the morning.     TURMERIC CURCUMIN PO Take 500 mg by mouth daily.     aspirin EC 81 MG tablet Take 81 mg by mouth daily. Swallow whole.      Results for orders placed or performed during the  hospital encounter of 10/22/22 (from the past 48 hour(s))  Basic metabolic panel per protocol     Status: Abnormal   Collection Time: 10/22/22  2:30 PM  Result Value Ref Range   Sodium 138 135 - 145 mmol/L   Potassium 3.7 3.5 - 5.1 mmol/L   Chloride 103 98 - 111 mmol/L   CO2 28 22 - 32 mmol/L   Glucose, Bld 95 70 - 99 mg/dL    Comment: Glucose reference range applies only to samples taken after fasting for at least 8 hours.   BUN 6 (L) 8 - 23 mg/dL   Creatinine, Ser 5.57 0.61 - 1.24 mg/dL   Calcium 9.3 8.9 - 32.2 mg/dL   GFR, Estimated >02 >54 mL/min    Comment: (NOTE) Calculated using the CKD-EPI Creatinine Equation (2021)    Anion gap 7 5 - 15    Comment: Performed at [facility], [address] [place], [place] [address]  CBC per protocol     Status: None   Collection Time: 10/22/22  2:30 PM  Result Value Ref Range   WBC 5.2 4.0 - 10.5 K/uL   RBC 4.59 4.22 - 5.81 MIL/uL   Hemoglobin 14.2 13.0 - 17.0 g/dL   HCT 37.6 28.3 - 15.1 %   MCV 89.5 80.0 - 100.0 fL   MCH 30.9 26.0 - 34.0 pg   MCHC 34.5 30.0 - 36.0 g/dL  RDW 12.3 11.5 - 15.5 %   Platelets 284 150 - 400 K/uL   nRBC 0.0 0.0 - 0.2 %    Comment: Performed at [facility], [address] [place], [place] [address]   No results found.  Review of Systems  Musculoskeletal:  Positive for neck pain.  Neurological:  Positive for numbness.    Blood pressure (!) 143/82, pulse 80, temperature 98.5 F (36.9 C), temperature source Oral, resp. rate 18, height 6' (1.829 m), weight 76.4 kg, SpO2 100 %. Physical Exam HENT:     Head: Normocephalic.     Nose: Nose normal.     Mouth/Throat:     Mouth: Mucous membranes are moist.  Eyes:     Pupils: Pupils are equal, round, and reactive to light.  Cardiovascular:     Rate and Rhythm: Normal rate.  Pulmonary:     Effort: Pulmonary effort is normal.  Abdominal:     General: Abdomen is flat.  Musculoskeletal:        General: Normal range of motion.  Skin:     General: Skin is warm.  Neurological:     Mental Status: He is alert.     Comments: Awake alert strength 5 out of 5 iliopsoas, quads, hamstrings, gastroc, into tibialis, and EHL.      Assessment/Plan 71 year old presents for decompressive lumbar laminectomy L2-L5.  Mariam Dollar, MD 10/24/2022, 1:59 PM

## 2022-10-24 NOTE — Anesthesia Preprocedure Evaluation (Signed)
Anesthesia Evaluation  Patient identified by MRN, date of birth, ID band Patient awake    Reviewed: Allergy & Precautions, NPO status , Patient's Chart, lab work & pertinent test results  History of Anesthesia Complications Negative for: history of anesthetic complications  Airway Mallampati: II  TM Distance: >3 FB Neck ROM: Full    Dental  (+) Dental Advisory Given   Pulmonary neg pulmonary ROS   Pulmonary exam normal        Cardiovascular hypertension, Pt. on medications Normal cardiovascular exam     Neuro/Psych negative neurological ROS     GI/Hepatic negative GI ROS, Neg liver ROS,,,  Endo/Other  negative endocrine ROS    Renal/GU negative Renal ROS  negative genitourinary   Musculoskeletal negative musculoskeletal ROS (+)    Abdominal   Peds  Hematology negative hematology ROS (+)   Anesthesia Other Findings   Reproductive/Obstetrics                              Anesthesia Physical Anesthesia Plan  ASA: 2  Anesthesia Plan: General   Post-op Pain Management: Tylenol PO (pre-op)* and Minimal or no pain anticipated   Induction: Intravenous  PONV Risk Score and Plan: 2 and Ondansetron, Dexamethasone, Treatment may vary due to age or medical condition and Midazolam  Airway Management Planned: Oral ETT  Additional Equipment: None  Intra-op Plan:   Post-operative Plan: Extubation in OR  Informed Consent: I have reviewed the patients History and Physical, chart, labs and discussed the procedure including the risks, benefits and alternatives for the proposed anesthesia with the patient or authorized representative who has indicated his/her understanding and acceptance.     Dental advisory given  Plan Discussed with:   Anesthesia Plan Comments:          Anesthesia Quick Evaluation

## 2022-10-24 NOTE — Transfer of Care (Signed)
Immediate Anesthesia Transfer of Care Note  Patient: Cristian Buck  Procedure(s) Performed: Sublaminar decompression - Lumbar Two-Three, Lumbar Three-Four, Lumbar Four-Five (Spine Lumbar)  Patient Location: PACU  Anesthesia Type:General  Level of Consciousness: awake and patient cooperative  Airway & Oxygen Therapy: Patient Spontanous Breathing and Patient connected to face mask oxygen  Post-op Assessment: Report given to RN, Post -op Vital signs reviewed and stable, and Patient moving all extremities  Post vital signs: Reviewed and stable  Last Vitals:  Vitals Value Taken Time  BP 113/62 10/24/22 1724  Temp    Pulse 79 10/24/22 1726  Resp 16 10/24/22 1728  SpO2 100 % 10/24/22 1726  Vitals shown include unvalidated device data.  Last Pain:  Vitals:   10/24/22 1321  TempSrc:   PainSc: 0-No pain      Patients Stated Pain Goal: 0 (10/24/22 1321)  Complications: No notable events documented.

## 2022-10-24 NOTE — Anesthesia Postprocedure Evaluation (Signed)
Anesthesia Post Note  Patient: Jayquan Bradsher  Procedure(s) Performed: Sublaminar decompression - Lumbar Two-Three, Lumbar Three-Four, Lumbar Four-Five (Spine Lumbar)     Patient location during evaluation: PACU Anesthesia Type: General Level of consciousness: awake Pain management: pain level controlled Vital Signs Assessment: post-procedure vital signs reviewed and stable Respiratory status: spontaneous breathing Cardiovascular status: stable Postop Assessment: no apparent nausea or vomiting Anesthetic complications: no   No notable events documented.  Last Vitals:  Vitals:   10/24/22 1730 10/24/22 1800  BP: 134/69   Pulse: 81   Resp: 18   Temp:  36.8 C  SpO2: 97%     Last Pain:  Vitals:   10/24/22 1800  TempSrc:   PainSc: 3                  Zonya Gudger

## 2022-10-24 NOTE — Anesthesia Procedure Notes (Signed)
Procedure Name: Intubation Date/Time: 10/24/2022 2:58 PM  Performed by: Janace Litten, CRNAPre-anesthesia Checklist: Patient identified, Emergency Drugs available, Suction available and Patient being monitored Patient Re-evaluated:Patient Re-evaluated prior to induction Oxygen Delivery Method: Circle System Utilized Preoxygenation: Pre-oxygenation with 100% oxygen Induction Type: IV induction Ventilation: Mask ventilation without difficulty Laryngoscope Size: Mac and 4 Grade View: Grade I Tube type: Oral Tube size: 7.5 mm Number of attempts: 1 Airway Equipment and Method: Stylet Placement Confirmation: ETT inserted through vocal cords under direct vision, positive ETCO2 and breath sounds checked- equal and bilateral Secured at: 23 cm Tube secured with: Tape Dental Injury: Teeth and Oropharynx as per pre-operative assessment

## 2022-10-24 NOTE — Op Note (Signed)
Preoperative diagnosis: Lumbar spinal stenosis L2-3, L3-4, L4-5 bilateral L3, L4, L5 radiculopathies and neurogenic claudication.  Postoperative diagnosis: Same.  Procedure: Decompressive lumbar laminectomies L2-3, L3-4, L4-5 with partial medial facetectomies and foraminotomies of the L2, L3, L4, and L5 nerve roots.  Surgeon: Donalee Citrin.  Assistant: Lisbeth Renshaw.  Assistant #2: Julien Girt.  Anesthesia: General.  EBL: 250.  HPI: 71 year old with respiratory and back bilateral hip and leg pain Difficulty walking only walk short distances before he had to stop and rest.  Workup revealed severe spinal stenosis primarily at L2-3 and L34.5.  Due to patient's progression of clinical syndrome imaging findings of a conservative treatment I recommend decompressive laminectomies at those 3 levels.  I extensively reviewed the risks and benefits of the operation with the patient as well as perioperative course expectations of outcome and alternatives to surgery and he understood and agreed to proceed forward.  Operative procedure: Patient was brought into the OR was induced under general anesthesia positioned prone Wilson frame his back was prepped and draped in routine sterile fashion.  Utilizing anatomical landmarks a midline incision was made centered over L2-L5 and after infiltration of 10 cc lidocaine via midline incision was made Bovie electrocautery was used to take down the subcutaneous tissue and subperiosteal dissection was carried of L2, L3, L4 and L5 bilaterally.  Interoperative x-ray confirmed defecation appropriate level.  So the spinous processes at L2-L3 and L4 were completely removed as well as the superior aspect the spinous process at L5 lamina was drilled down and thinned out central decompression was begun.  There was severe hourglass compression especially at the L2-3 and L3-4 interspaces so marching down the gutters they were under Bitton and partial medial facetectomies were  performed at L2-3, L3-4, L4-5.  Foraminotomies were then performed at the L2, L3, L4 and L5 nerve roots bilaterally.  At the end of decompression there was no further stenosis I was easily able to pass a coronary dilator out all the foramina to confirm adequate decompression the wound was then copiously irrigated meticulous hemostasis was maintained Gelfoam was ON top of the dura a medium Hemovac drain was placed and Marcaine was injected in the fascia the wound was then closed in layers with interrupted Vicryl and a running 4 subcuticular.  Dermabond benzoin Steri-Strips and a sterile dressing was applied patient recovery in stable condition.  At the end of the case all needle counts and sponge counts were correct.

## 2022-10-24 NOTE — Anesthesia Postprocedure Evaluation (Signed)
Anesthesia Post Note  Patient: Cristian Buck  Procedure(s) Performed: Sublaminar decompression - Lumbar Two-Three, Lumbar Three-Four, Lumbar Four-Five (Spine Lumbar)     Patient location during evaluation: PACU Anesthesia Type: General Level of consciousness: awake Pain management: pain level controlled Vital Signs Assessment: post-procedure vital signs reviewed and stable Respiratory status: spontaneous breathing Cardiovascular status: stable Postop Assessment: no apparent nausea or vomiting Anesthetic complications: no   No notable events documented.  Last Vitals:  Vitals:   10/24/22 1321 10/24/22 1725  BP: (!) 143/82   Pulse:    Resp:    Temp:  36.8 C  SpO2:      Last Pain:  Vitals:   10/24/22 1725  TempSrc:   PainSc: 0-No pain                 Zaniel Marineau

## 2022-10-25 ENCOUNTER — Encounter (HOSPITAL_COMMUNITY): Payer: Self-pay | Admitting: Neurosurgery

## 2022-10-25 DIAGNOSIS — M48062 Spinal stenosis, lumbar region with neurogenic claudication: Secondary | ICD-10-CM | POA: Diagnosis not present

## 2022-10-25 MED ORDER — HYDROCODONE-ACETAMINOPHEN 5-325 MG PO TABS: 2.0000 | ORAL_TABLET | ORAL | 0 refills | Status: DC | PRN

## 2022-10-25 NOTE — Plan of Care (Signed)
  Problem: Education: Goal: Ability to verbalize activity precautions or restrictions will improve Outcome: Progressing Goal: Knowledge of the prescribed therapeutic regimen will improve Outcome: Progressing Goal: Understanding of discharge needs will improve Outcome: Progressing   Problem: Activity: Goal: Ability to avoid complications of mobility impairment will improve Outcome: Progressing Goal: Ability to tolerate increased activity will improve Outcome: Progressing Goal: Will remain free from falls Outcome: Progressing   Problem: Bowel/Gastric: Goal: Gastrointestinal status for postoperative course will improve Outcome: Progressing   Problem: Pain Management: Goal: Pain level will decrease Outcome: Progressing   Problem: Skin Integrity: Goal: Will show signs of wound healing Outcome: Progressing

## 2022-10-25 NOTE — Discharge Summary (Signed)
  Physician Discharge Summary  Patient ID: Acie Custis MRN: [id number] DOB/AGE: January 16, 1951 71 y.o. Estimated body mass index is 22.84 kg/m as calculated from the following:   Height as of this encounter: 6' (1.829 m).   Weight as of this encounter: 76.4 kg.   Admit date: 10/24/2022 Discharge date: 10/25/2022  Admission Diagnoses: Lumbar spinal stenosis L2-L5  Discharge Diagnoses: Same Principal Problem:   Spinal stenosis of lumbar region   Discharged Condition: good  Hospital Course: Patient admitted to hospital underwent decompressive laminectomy L2-3, L3-4, L4-5 postoperative did very well when covering the floor on the floor was ambulating and voiding spontaneously tolerating regular diet and stable for discharge home with significant proven back and leg pain  Consults: Significant Diagnostic Studies: Treatments: Decompressive laminectomy L2-L5 Discharge Exam: Blood pressure 105/62, pulse 78, temperature 98.1 F (36.7 C), temperature source Oral, resp. rate 18, height 6' (1.829 m), weight 76.4 kg, SpO2 98 %. Strength 5-5 and clean dry and intact  Disposition: Home   Allergies as of 10/25/2022   No Known Allergies      Medication List     TAKE these medications    amLODipine 5 MG tablet Commonly known as: NORVASC Take 5 mg by mouth in the morning.   aspirin EC 81 MG tablet Take 81 mg by mouth daily. Swallow whole.   Cinnamon 500 MG Tabs Take 500 mg by mouth daily.   HYDROcodone-acetaminophen 5-325 MG tablet Commonly known as: NORCO/VICODIN Take 2 tablets by mouth every 4 (four) hours as needed for severe pain ((score 7 to 10)).   latanoprost 0.005 % ophthalmic solution Commonly known as: XALATAN Place 1 drop into both eyes at bedtime.   multivitamin with minerals Tabs tablet Take 1 tablet by mouth daily.   rosuvastatin 10 MG tablet Commonly known as: CRESTOR Take 10 mg by mouth in the morning.   telmisartan-hydrochlorothiazide 80-12.5 MG  tablet Commonly known as: MICARDIS HCT Take 1 tablet by mouth in the morning.   TURMERIC CURCUMIN PO Take 500 mg by mouth daily.         Signed: Mariam Dollar 10/25/2022, 8:04 AM

## 2022-10-25 NOTE — Evaluation (Signed)
Physical Therapy Evaluation and Discharge Patient Details Name: Cristian Buck MRN: [id number] DOB: 1950-12-19 Today's Date: 10/25/2022  History of Present Illness  Pt is 71 yo male who presents 10/24/22 for L2-3, L3-4, L4-5 laminectomies. PMH: HTN, headaches, neurogenic claudication  Clinical Impression  Patient evaluated by Physical Therapy with no further acute PT needs identified. All education has been completed and the patient has no further questions. Pt mobilizing independently however, he has LE weakness that is affecting his gait pattern. Recommend outpt PT after d/c to work on specific strengthening and balance.  See below for any follow-up Physical Therapy or equipment needs. PT is signing off. Thank you for this referral.        Recommendations for follow up therapy are one component of a multi-disciplinary discharge planning process, led by the attending physician.  Recommendations may be updated based on patient status, additional functional criteria and insurance authorization.  Follow Up Recommendations Follow physician's recommendations for discharge plan and follow up therapies (PT recommending outpt PT for strengthening and balance)      Assistance Recommended at Discharge PRN  Patient can return home with the following  Assistance with cooking/housework    Equipment Recommendations None recommended by PT  Recommendations for Other Services       Functional Status Assessment Patient has not had a recent decline in their functional status     Precautions / Restrictions Precautions Precautions: Back Precaution Booklet Issued: Yes (comment) Precaution Comments: reviewed precautions and proper posture as well as activity modifications Restrictions Weight Bearing Restrictions: No      Mobility  Bed Mobility Overal bed mobility: Modified Independent             General bed mobility comments: vc's for not twisting    Transfers Overall transfer level:  Modified independent Equipment used: None               General transfer comment: safe with transfers    Ambulation/Gait Ambulation/Gait assistance: Modified independent (Device/Increase time) Gait Distance (Feet): 250 Feet Assistive device: None Gait Pattern/deviations: Antalgic, Trunk flexed Gait velocity: WFL Gait velocity interpretation: >4.37 ft/sec, indicative of normal walking speed   General Gait Details: pt with weakness noted hip extensors and mildly shortened hip flexors, position is not fixed as pt is able to lie flat on his back on bed but in standing flexes at hips. Worked on correction but will need some strengthening to be able to maintain  Stairs Stairs: Yes Stairs assistance: Modified independent (Device/Increase time) Stair Management: One rail Left, Alternating pattern, Forwards Number of Stairs: 5 General stair comments: uses momentum when stepping up with LLE first  Wheelchair Mobility    Modified Rankin (Stroke Patients Only)       Balance Overall balance assessment: Mild deficits observed, not formally tested                                           Pertinent Vitals/Pain Pain Assessment Pain Assessment: Faces Faces Pain Scale: Hurts a little bit Pain Location: back Pain Descriptors / Indicators: Operative site guarding Pain Intervention(s): Limited activity within patient's tolerance    Home Living Family/patient expects to be discharged to:: Private residence Living Arrangements: Spouse/significant other Available Help at Discharge: Available 24 hours/day;Family Type of Home: House Home Access: Stairs to enter Entrance Stairs-Rails: Right Entrance Stairs-Number of Steps: 4   Home Layout: One level  Home Equipment: None      Prior Function Prior Level of Function : Independent/Modified Independent             Mobility Comments: ambulated with a limp and stooped posture per his reports ADLs Comments:  independent     Hand Dominance        Extremity/Trunk Assessment   Upper Extremity Assessment Upper Extremity Assessment: Overall WFL for tasks assessed    Lower Extremity Assessment Lower Extremity Assessment: Generalized weakness (from nerve compression)    Cervical / Trunk Assessment Cervical / Trunk Assessment: Kyphotic  Communication   Communication: No difficulties  Cognition Arousal/Alertness: Awake/alert Behavior During Therapy: WFL for tasks assessed/performed Overall Cognitive Status: Within Functional Limits for tasks assessed                                          General Comments      Exercises     Assessment/Plan    PT Assessment All further PT needs can be met in the next venue of care  PT Problem List Decreased strength;Decreased balance;Pain       PT Treatment Interventions      PT Goals (Current goals can be found in the Care Plan section)  Acute Rehab PT Goals Patient Stated Goal: return home, stand up straight PT Goal Formulation: All assessment and education complete, DC therapy    Frequency       Co-evaluation               AM-PAC PT "6 Clicks" Mobility  Outcome Measure Help needed turning from your back to your side while in a flat bed without using bedrails?: None Help needed moving from lying on your back to sitting on the side of a flat bed without using bedrails?: None Help needed moving to and from a bed to a chair (including a wheelchair)?: None Help needed standing up from a chair using your arms (e.g., wheelchair or bedside chair)?: None Help needed to walk in hospital room?: None Help needed climbing 3-5 steps with a railing? : None 6 Click Score: 24    End of Session   Activity Tolerance: Patient tolerated treatment well Patient left: in chair;with call bell/phone within reach Nurse Communication: Mobility status PT Visit Diagnosis: Unsteadiness on feet (R26.81);Pain;Difficulty in walking,  not elsewhere classified (R26.2) Pain - part of body:  (back)    Time: 5638-9373 PT Time Calculation (min) (ACUTE ONLY): 28 min   Charges:   PT Evaluation $PT Eval Moderate Complexity: 1 Mod PT Treatments $Gait Training: 8-22 mins        Leighton Roach, PT  Acute Rehab Services Secure chat preferred Office [place] 10/25/2022, 12:13 PM

## 2024-11-30 ENCOUNTER — Other Ambulatory Visit: Payer: Self-pay | Admitting: Neurosurgery

## 2024-12-16 ENCOUNTER — Encounter (HOSPITAL_COMMUNITY): Payer: Self-pay

## 2024-12-16 ENCOUNTER — Inpatient Hospital Stay (HOSPITAL_COMMUNITY): Admission: RE | Admit: 2024-12-16 | Discharge: 2024-12-16 | Attending: Neurosurgery

## 2024-12-16 ENCOUNTER — Other Ambulatory Visit: Payer: Self-pay

## 2024-12-16 VITALS — BP 140/83 | HR 74 | Temp 98.8°F | Resp 18 | Ht 69.0 in | Wt 168.6 lb

## 2024-12-16 DIAGNOSIS — Z01818 Encounter for other preprocedural examination: Secondary | ICD-10-CM | POA: Insufficient documentation

## 2024-12-16 HISTORY — DX: Unspecified osteoarthritis, unspecified site: M19.90

## 2024-12-16 LAB — CBC
HCT: 42.6 % (ref 39.0–52.0)
Hemoglobin: 15.3 g/dL (ref 13.0–17.0)
MCH: 31.7 pg (ref 26.0–34.0)
MCHC: 35.9 g/dL (ref 30.0–36.0)
MCV: 88.4 fL (ref 80.0–100.0)
Platelets: 229 10*3/uL (ref 150–400)
RBC: 4.82 MIL/uL (ref 4.22–5.81)
RDW: 11.9 % (ref 11.5–15.5)
WBC: 6.2 10*3/uL (ref 4.0–10.5)
nRBC: 0 % (ref 0.0–0.2)

## 2024-12-16 LAB — TYPE AND SCREEN
ABO/RH(D): O POS
Antibody Screen: NEGATIVE

## 2024-12-16 LAB — SURGICAL PCR SCREEN
MRSA, PCR: NEGATIVE
Staphylococcus aureus: NEGATIVE

## 2024-12-16 NOTE — Progress Notes (Addendum)
 PCP - Dr. Alm Rav Cardiologist - denies  PPM/ICD - denies Device Orders - na Rep Notified - na  Chest x-ray -  EKG - PAT, 12/16/2024 Stress Test -  ECHO -  Cardiac Cath -   Sleep Study - denies CPAP - na  Non-diabetic  Blood Thinner Instructions: denies Aspirin  Instructions:states last dose 12/10/2024  ERAS Protcol - NPO  Anesthesia review: No  Patient denies shortness of breath, fever, cough and chest pain at PAT appointment   All instructions explained to the patient, with a verbal understanding of the material. Patient agrees to go over the instructions while at home for a better understanding. Patient also instructed to self quarantine after being tested for COVID-19. The opportunity to ask questions was provided.

## 2024-12-18 ENCOUNTER — Other Ambulatory Visit: Payer: Self-pay

## 2024-12-18 ENCOUNTER — Encounter (HOSPITAL_COMMUNITY): Payer: Self-pay | Admitting: Neurosurgery

## 2024-12-18 ENCOUNTER — Ambulatory Visit (HOSPITAL_COMMUNITY)

## 2024-12-18 ENCOUNTER — Ambulatory Visit (HOSPITAL_COMMUNITY): Admission: RE | Admit: 2024-12-18 | Source: Home / Self Care | Admitting: Neurosurgery

## 2024-12-18 ENCOUNTER — Encounter (HOSPITAL_COMMUNITY): Admission: RE | Payer: Self-pay | Source: Home / Self Care

## 2024-12-18 DIAGNOSIS — M4712 Other spondylosis with myelopathy, cervical region: Secondary | ICD-10-CM | POA: Diagnosis present

## 2024-12-18 LAB — BASIC METABOLIC PANEL WITH GFR
Anion gap: 8 (ref 5–15)
BUN: 15 mg/dL (ref 8–23)
CO2: 28 mmol/L (ref 22–32)
Calcium: 9.3 mg/dL (ref 8.9–10.3)
Chloride: 101 mmol/L (ref 98–111)
Creatinine, Ser: 1.05 mg/dL (ref 0.61–1.24)
GFR, Estimated: >60 mL/min
Glucose, Bld: 108 mg/dL — ABNORMAL HIGH (ref 70–99)
Potassium: 4 mmol/L (ref 3.5–5.1)
Sodium: 138 mmol/L (ref 135–145)

## 2024-12-18 LAB — POCT I-STAT 7, (LYTES, BLD GAS, ICA,H+H)
Acid-base deficit: 1 mmol/L (ref 0.0–2.0)
Bicarbonate: 22.2 mmol/L (ref 20.0–28.0)
Calcium, Ion: 1.14 mmol/L — ABNORMAL LOW (ref 1.15–1.40)
HCT: 34 % — ABNORMAL LOW (ref 39.0–52.0)
Hemoglobin: 11.6 g/dL — ABNORMAL LOW (ref 13.0–17.0)
O2 Saturation: 100 %
Patient temperature: 36
Potassium: 3.5 mmol/L (ref 3.5–5.1)
Sodium: 139 mmol/L (ref 135–145)
TCO2: 23 mmol/L (ref 22–32)
pCO2 arterial: 31.7 mmHg — ABNORMAL LOW (ref 32–48)
pH, Arterial: 7.449 (ref 7.35–7.45)
pO2, Arterial: 299 mmHg — ABNORMAL HIGH (ref 83–108)

## 2024-12-18 LAB — ABO/RH: ABO/RH(D): O POS

## 2024-12-18 MED ORDER — MIDAZOLAM HCL (PF) 2 MG/2ML IJ SOLN
INTRAMUSCULAR | Status: DC | PRN
  Administered 2024-12-18: 2 mg via INTRAVENOUS

## 2024-12-18 MED ORDER — ACETAMINOPHEN 10 MG/ML IV SOLN
1000.0000 mg | Freq: Once | INTRAVENOUS | Status: DC | PRN
  Administered 2024-12-18: 1000 mg via INTRAVENOUS

## 2024-12-18 MED ORDER — ROCURONIUM BROMIDE 10 MG/ML (PF) SYRINGE
PREFILLED_SYRINGE | INTRAVENOUS | Status: DC | PRN
  Administered 2024-12-18 (×3): 30 mg via INTRAVENOUS
  Administered 2024-12-18: 70 mg via INTRAVENOUS
  Administered 2024-12-18: 30 mg via INTRAVENOUS

## 2024-12-18 MED ORDER — ALBUMIN HUMAN 5 % IV SOLN: INTRAVENOUS | Status: DC | PRN

## 2024-12-18 MED ORDER — SODIUM CHLORIDE 0.9% FLUSH
3.0000 mL | Freq: Two times a day (BID) | INTRAVENOUS | Status: AC
  Administered 2024-12-18 (×2): 3 mL via INTRAVENOUS

## 2024-12-18 MED ORDER — PHENOL 1.4 % MT LIQD: 1.0000 | OROMUCOSAL | Status: AC | PRN

## 2024-12-18 MED ORDER — TELMISARTAN-HCTZ 80-12.5 MG PO TABS: 1.0000 | ORAL_TABLET | Freq: Every morning | ORAL | Status: DC

## 2024-12-18 MED ORDER — LATANOPROST 0.005 % OP SOLN
1.0000 [drp] | Freq: Every day | OPHTHALMIC | Status: AC
  Administered 2024-12-18: 1 [drp] via OPHTHALMIC
  Filled 2024-12-18: qty 2.5

## 2024-12-18 MED ORDER — LACTATED RINGERS IV SOLN: INTRAVENOUS | Status: DC

## 2024-12-18 MED ORDER — THROMBIN 20000 UNITS EX SOLR: CUTANEOUS | Status: DC | PRN

## 2024-12-18 MED ORDER — HYDROMORPHONE HCL 1 MG/ML IJ SOLN
0.2500 mg | INTRAMUSCULAR | Status: DC | PRN
  Administered 2024-12-18: 0.5 mg via INTRAVENOUS

## 2024-12-18 MED ORDER — HYDROCODONE-ACETAMINOPHEN 5-325 MG PO TABS
2.0000 | ORAL_TABLET | ORAL | Status: AC | PRN
  Administered 2024-12-18: 2 via ORAL
  Filled 2024-12-18: qty 2

## 2024-12-18 MED ORDER — MENTHOL 3 MG MT LOZG: 1.0000 | LOZENGE | OROMUCOSAL | Status: AC | PRN

## 2024-12-18 MED ORDER — BUPIVACAINE LIPOSOME 1.3 % IJ SUSP
INTRAMUSCULAR | Status: AC
  Filled 2024-12-18: qty 20

## 2024-12-18 MED ORDER — IRBESARTAN 150 MG PO TABS: 300.0000 mg | ORAL_TABLET | Freq: Every day | ORAL | Status: AC

## 2024-12-18 MED ORDER — PANTOPRAZOLE SODIUM 40 MG IV SOLR: 40.0000 mg | Freq: Every day | INTRAVENOUS | Status: DC

## 2024-12-18 MED ORDER — AMLODIPINE BESYLATE 5 MG PO TABS: 5.0000 mg | ORAL_TABLET | Freq: Every morning | ORAL | Status: AC

## 2024-12-18 MED ORDER — TURMERIC CURCUMIN PO CAPS: ORAL_CAPSULE | Freq: Every day | ORAL | Status: DC

## 2024-12-18 MED ORDER — MIDAZOLAM HCL 2 MG/2ML IJ SOLN
INTRAMUSCULAR | Status: AC
  Filled 2024-12-18: qty 2

## 2024-12-18 MED ORDER — OXYCODONE HCL 5 MG/5ML PO SOLN: 5.0000 mg | Freq: Once | ORAL | Status: DC | PRN

## 2024-12-18 MED ORDER — BUPIVACAINE-EPINEPHRINE (PF) 0.25% -1:200000 IJ SOLN
INTRAMUSCULAR | Status: AC
  Filled 2024-12-18: qty 30

## 2024-12-18 MED ORDER — THROMBIN 5000 UNITS EX KIT
PACK | CUTANEOUS | Status: AC
  Filled 2024-12-18: qty 1

## 2024-12-18 MED ORDER — HYDROCHLOROTHIAZIDE 12.5 MG PO TABS: 12.5000 mg | ORAL_TABLET | Freq: Every day | ORAL | Status: AC

## 2024-12-18 MED ORDER — CHLORHEXIDINE GLUCONATE 0.12 % MT SOLN
15.0000 mL | Freq: Once | OROMUCOSAL | Status: AC
  Administered 2024-12-18: 15 mL via OROMUCOSAL
  Filled 2024-12-18: qty 15

## 2024-12-18 MED ORDER — PHENYLEPHRINE 80 MCG/ML (10ML) SYRINGE FOR IV PUSH (FOR BLOOD PRESSURE SUPPORT)
PREFILLED_SYRINGE | INTRAVENOUS | Status: DC | PRN
  Administered 2024-12-18: 160 ug via INTRAVENOUS
  Administered 2024-12-18 (×2): 80 ug via INTRAVENOUS
  Administered 2024-12-18 (×2): 160 ug via INTRAVENOUS

## 2024-12-18 MED ORDER — SODIUM CHLORIDE 0.9% FLUSH: 3.0000 mL | INTRAVENOUS | Status: AC | PRN

## 2024-12-18 MED ORDER — LIDOCAINE 2% (20 MG/ML) 5 ML SYRINGE
INTRAMUSCULAR | Status: DC | PRN
  Administered 2024-12-18: 100 mg via INTRAVENOUS

## 2024-12-18 MED ORDER — HYDROMORPHONE HCL 1 MG/ML IJ SOLN
INTRAMUSCULAR | Status: AC
  Filled 2024-12-18: qty 1

## 2024-12-18 MED ORDER — ONDANSETRON HCL 4 MG/2ML IJ SOLN
INTRAMUSCULAR | Status: DC | PRN
  Administered 2024-12-18: 4 mg via INTRAVENOUS

## 2024-12-18 MED ORDER — ASPIRIN 81 MG PO TBEC
81.0000 mg | DELAYED_RELEASE_TABLET | Freq: Every day | ORAL | Status: AC
  Administered 2024-12-18: 81 mg via ORAL
  Filled 2024-12-18: qty 1

## 2024-12-18 MED ORDER — PROPOFOL 10 MG/ML IV BOLUS
INTRAVENOUS | Status: AC
  Filled 2024-12-18: qty 20

## 2024-12-18 MED ORDER — SODIUM CHLORIDE 0.9 % IV SOLN: 250.0000 mL | INTRAVENOUS | Status: AC

## 2024-12-18 MED ORDER — HYDROMORPHONE HCL 1 MG/ML IJ SOLN
INTRAMUSCULAR | Status: AC
  Filled 2024-12-18: qty 0.5

## 2024-12-18 MED ORDER — ONDANSETRON HCL 4 MG/2ML IJ SOLN: 4.0000 mg | Freq: Four times a day (QID) | INTRAMUSCULAR | Status: AC | PRN

## 2024-12-18 MED ORDER — SODIUM CHLORIDE (PF) 0.9 % IJ SOLN
INTRAMUSCULAR | Status: AC
  Filled 2024-12-18: qty 50

## 2024-12-18 MED ORDER — ONDANSETRON HCL 4 MG PO TABS: 4.0000 mg | ORAL_TABLET | Freq: Four times a day (QID) | ORAL | Status: AC | PRN

## 2024-12-18 MED ORDER — CHLORHEXIDINE GLUCONATE CLOTH 2 % EX PADS: 6.0000 | MEDICATED_PAD | Freq: Once | CUTANEOUS | Status: DC

## 2024-12-18 MED ORDER — TRANEXAMIC ACID 1000 MG/10ML IV SOLN
INTRAVENOUS | Status: AC
  Filled 2024-12-18: qty 30

## 2024-12-18 MED ORDER — FENTANYL CITRATE (PF) 250 MCG/5ML IJ SOLN
INTRAMUSCULAR | Status: AC
  Filled 2024-12-18: qty 5

## 2024-12-18 MED ORDER — CEFAZOLIN SODIUM-DEXTROSE 2-4 GM/100ML-% IV SOLN
2.0000 g | Freq: Three times a day (TID) | INTRAVENOUS | Status: AC
  Administered 2024-12-18: 2 g via INTRAVENOUS
  Filled 2024-12-18: qty 100

## 2024-12-18 MED ORDER — ADULT MULTIVITAMIN W/MINERALS CH
1.0000 | ORAL_TABLET | Freq: Every day | ORAL | Status: AC
  Administered 2024-12-18: 1 via ORAL
  Filled 2024-12-18: qty 1

## 2024-12-18 MED ORDER — ORAL CARE MOUTH RINSE: 15.0000 mL | Freq: Once | OROMUCOSAL | Status: AC

## 2024-12-18 MED ORDER — LACTATED RINGERS IV SOLN: INTRAVENOUS | Status: DC | PRN

## 2024-12-18 MED ORDER — SODIUM CHLORIDE (PF) 0.9 % IJ SOLN
INTRAMUSCULAR | Status: AC
  Filled 2024-12-18: qty 100

## 2024-12-18 MED ORDER — ACETAMINOPHEN 650 MG RE SUPP: 650.0000 mg | RECTAL | Status: AC | PRN

## 2024-12-18 MED ORDER — HYDROMORPHONE HCL 1 MG/ML IJ SOLN: 0.5000 mg | INTRAMUSCULAR | Status: AC | PRN

## 2024-12-18 MED ORDER — PROPOFOL 10 MG/ML IV BOLUS
INTRAVENOUS | Status: DC | PRN
  Administered 2024-12-18: 50 mg via INTRAVENOUS
  Administered 2024-12-18: 100 mg via INTRAVENOUS
  Administered 2024-12-18: 50 mg via INTRAVENOUS

## 2024-12-18 MED ORDER — CINNAMON 500 MG PO CAPS: 1000.0000 mg | ORAL_CAPSULE | Freq: Every day | ORAL | Status: DC

## 2024-12-18 MED ORDER — ALUM & MAG HYDROXIDE-SIMETH 200-200-20 MG/5ML PO SUSP: 30.0000 mL | Freq: Four times a day (QID) | ORAL | Status: AC | PRN

## 2024-12-18 MED ORDER — PANTOPRAZOLE SODIUM 40 MG PO TBEC
40.0000 mg | DELAYED_RELEASE_TABLET | Freq: Every day | ORAL | Status: AC
  Administered 2024-12-18: 40 mg via ORAL
  Filled 2024-12-18: qty 1

## 2024-12-18 MED ORDER — OXYCODONE HCL 5 MG PO TABS: 5.0000 mg | ORAL_TABLET | Freq: Once | ORAL | Status: DC | PRN

## 2024-12-18 MED ORDER — ROSUVASTATIN CALCIUM 5 MG PO TABS: 10.0000 mg | ORAL_TABLET | Freq: Every morning | ORAL | Status: AC

## 2024-12-18 MED ORDER — AMISULPRIDE (ANTIEMETIC) 5 MG/2ML IV SOLN: 10.0000 mg | Freq: Once | INTRAVENOUS | Status: DC | PRN

## 2024-12-18 MED ORDER — FENTANYL CITRATE (PF) 250 MCG/5ML IJ SOLN
INTRAMUSCULAR | Status: DC | PRN
  Administered 2024-12-18: 50 ug via INTRAVENOUS
  Administered 2024-12-18: 100 ug via INTRAVENOUS
  Administered 2024-12-18 (×2): 50 ug via INTRAVENOUS

## 2024-12-18 MED ORDER — 0.9 % SODIUM CHLORIDE (POUR BTL) OPTIME
TOPICAL | Status: DC | PRN
  Administered 2024-12-18: 1000 mL

## 2024-12-18 MED ORDER — ACETAMINOPHEN 325 MG PO TABS: 650.0000 mg | ORAL_TABLET | ORAL | Status: AC | PRN

## 2024-12-18 MED ORDER — CEFAZOLIN SODIUM-DEXTROSE 2-4 GM/100ML-% IV SOLN
2.0000 g | INTRAVENOUS | Status: AC
  Administered 2024-12-18 (×2): 2 g via INTRAVENOUS
  Filled 2024-12-18: qty 100

## 2024-12-18 MED ORDER — THROMBIN 20000 UNITS EX SOLR
CUTANEOUS | Status: AC
  Filled 2024-12-18: qty 20000

## 2024-12-18 MED ORDER — DEXAMETHASONE SOD PHOSPHATE PF 10 MG/ML IJ SOLN
INTRAMUSCULAR | Status: DC | PRN
  Administered 2024-12-18: 10 mg via INTRAVENOUS

## 2024-12-18 MED ORDER — CYCLOBENZAPRINE HCL 10 MG PO TABS: 10.0000 mg | ORAL_TABLET | Freq: Three times a day (TID) | ORAL | Status: AC | PRN

## 2024-12-18 MED ORDER — ONDANSETRON HCL 4 MG/2ML IJ SOLN: 4.0000 mg | Freq: Once | INTRAMUSCULAR | Status: DC | PRN

## 2024-12-18 MED ORDER — HYDROMORPHONE HCL 1 MG/ML IJ SOLN
INTRAMUSCULAR | Status: DC | PRN
  Administered 2024-12-18 (×2): .5 mg via INTRAVENOUS

## 2024-12-18 MED ORDER — THROMBIN 5000 UNITS EX SOLR: OROMUCOSAL | Status: DC | PRN

## 2024-12-18 MED ORDER — SUGAMMADEX SODIUM 200 MG/2ML IV SOLN
INTRAVENOUS | Status: DC | PRN
  Administered 2024-12-18: 200 mg via INTRAVENOUS

## 2024-12-18 MED ORDER — PHENYLEPHRINE HCL-NACL 20-0.9 MG/250ML-% IV SOLN
INTRAVENOUS | Status: DC | PRN
  Administered 2024-12-18: 30 ug/min via INTRAVENOUS

## 2024-12-18 NOTE — H&P (Signed)
 Cristian Buck is an 74 y.o. male.   Chief Complaint: Neck pain numbness in his hands difficulty walking HPI: 74 year old gentleman has had intermittent numbness in his hands difficulty walking and balance issues.  Workup has revealed severe spinal stenosis over multiple levels at C3-4, C4-5, C5-6, C6-7.  Due to patient's progression of clinical syndrome consistent with myelopathy imaging showing cord compression at 4 cervical levels we have recommended anterior cervical discectomies and fusion at all 4 levels.  I have extensively gone over the risks and benefits of the operation with the patient as well as perioperative course expectations of outcome and alternatives to surgery and he understood and agreed to proceed forward.  Past Medical History:  Diagnosis Date   Arthritis    Headache    tension headaches   Hypertension     Past Surgical History:  Procedure Laterality Date   COLONOSCOPY  09/18/2022   LUMBAR LAMINECTOMY/DECOMPRESSION MICRODISCECTOMY N/A 10/24/2022   Procedure: Sublaminar decompression - Lumbar Two-Three, Lumbar Three-Four, Lumbar Four-Five;  Surgeon: Onetha Kuba, MD;  Location: [facility] OR;  Service: Neurosurgery;  Laterality: N/A;   TOE SURGERY Right    right little toe- shaved bone    History reviewed. No pertinent family history. Social History:  reports that he has never smoked. He has never used smokeless tobacco. He reports that he does not drink alcohol and does not use drugs.  Allergies: Allergies[1]  Medications Prior to Admission  Medication Sig Dispense Refill   amLODipine  (NORVASC ) 5 MG tablet Take 5 mg by mouth in the morning.     aspirin  EC 81 MG tablet Take 81 mg by mouth daily. Swallow whole.     latanoprost  (XALATAN ) 0.005 % ophthalmic solution Place 1 drop into both eyes at bedtime.     Multiple Vitamin (MULTIVITAMIN WITH MINERALS) TABS tablet Take 1 tablet by mouth daily.     rosuvastatin  (CRESTOR ) 10 MG tablet Take 10 mg by mouth in the morning.      telmisartan -hydrochlorothiazide  (MICARDIS  HCT) 80-12.5 MG tablet Take 1 tablet by mouth in the morning.     TURMERIC CURCUMIN PO Take 500 mg by mouth daily.     CINNAMON  PO Take 1,000 mg by mouth daily.      Results for orders placed or performed during the hospital encounter of 12/18/24 (from the past 48 hours)  ABO/Rh     Status: None (Preliminary result)   Collection Time: 12/18/24  6:24 AM  Result Value Ref Range   ABO/RH(D) PENDING   Basic metabolic panel     Status: Abnormal   Collection Time: 12/18/24  6:33 AM  Result Value Ref Range   Sodium 138 135 - 145 mmol/L   Potassium 4.0 3.5 - 5.1 mmol/L   Chloride 101 98 - 111 mmol/L   CO2 28 22 - 32 mmol/L   Glucose, Bld 108 (H) 70 - 99 mg/dL    Comment: Glucose reference range applies only to samples taken after fasting for at least 8 hours.   BUN 15 8 - 23 mg/dL   Creatinine, Ser 8.94 0.61 - 1.24 mg/dL   Calcium  9.3 8.9 - 10.3 mg/dL   GFR, Estimated >39 >39 mL/min    Comment: (NOTE) Calculated using the CKD-EPI Creatinine Equation (2021)    Anion gap 8 5 - 15    Comment: Performed at [facility], [address], [place], [place] [address]   No results found.  Review of Systems  Neurological:  Positive for numbness.  Blood pressure (!) 148/85, pulse 77, temperature 98.2 F (36.8 C), temperature source Oral, resp. rate 18, height 5' 9 (1.753 m), weight 76.5 kg, SpO2 97%. Physical Exam HENT:     Head: Normocephalic.     Right Ear: Tympanic membrane normal.     Nose: Nose normal.     Mouth/Throat:     Mouth: Mucous membranes are moist.  Cardiovascular:     Rate and Rhythm: Normal rate.     Pulses: Normal pulses.  Pulmonary:     Effort: Pulmonary effort is normal.  Abdominal:     General: Abdomen is flat.  Musculoskeletal:        General: Normal range of motion.     Cervical back: Normal range of motion.  Skin:    General: Skin is warm.  Neurological:     Mental Status: He is alert.     Comments:  Strength is 5 out of 5 deltoid, bicep, tricep, wrist flexion, wrist extension, hand intrinsics.      Assessment/Plan 74 year old presents for ACDF at C3-4 C4-5 C5-6 C6-7.  Cristian SHAUNNA Helling, MD 12/18/2024, 7:19 AM       [1]  Allergies Allergen Reactions   Red Yeast Rice Extract     Other Reaction(s): patient felt strange

## 2024-12-18 NOTE — Op Note (Signed)
 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  Postoperative diagnosis: Same.  Procedure: Anterior cervical discectomies and fusion at C3-4, C4-5, C5-6, C6-7 utilizing allograft and anterior cervical plating with a [employer] Atlantis translational plating system.  Surgeon: Arley helling.  Assistant: Rockey Peru.  Anesthesia: General.  EBL: 500.  HPI: 74 year old gentleman with severe neck pain numbness tingling weakness in his hands difficulty walking.  Workup revealed severe cord compression from spinal stenosis at C3-4 C4-5 and C5-6 and C6-7..  Due to the patient's progression of clinical syndrome imaging findings and failed conservative treatment I recommended anterior cervical discectomies and fusion at those 4 levels.  I extensively reviewed the risks and benefits of the operation with the patient as well as perioperative course expectations of outcome and alternatives to surgery and he understood and agreed to proceed forward.  Operative procedure: Patient was brought into the OR was induced under general anesthesia positioned supine the neck in slight extension 5 pounds of halter traction.  The right side of his neck was prepped and draped in routine sterile fashion.  Preoperative x-ray to localize the appropriate level.  A curvilinear incision was made just off the midline to the anter border of the sternomastoid and the superficial abscess was dissected out divided longitudinally.  I then dissected through the avascular plane there was a large superficial vein that was coursing along the sternocleidomastoid that I was working around however got into the vein on the very inferior aspect of it underneath the corner and dissected out the platysma.  Difficult to visualize difficult to say however with packing away and working around it I was able to get to 2-0 silk sutures figure-of-eight into that tie off the vein and that  arrested the bleeding.  However that was after he lost about 400 cc of blood.  I then continued the avascular dissection down to the prevertebral fascia which was dissected away with Kitners and I dissected free and expose C3-4, C4-5, C5-6, C6-7.  I reflected the longus coli laterally at all 4 levels.  I then positioned the retractor at C3-4 and C4-5 and the spine was really markedly spondylitic at this level I bit off the anterior osteophytes with a Leksell rongeur and a 2-3 Miller Kerrison punch I then drilled down both the spaces.  And then under microscopic illumination first working at C3-4 with distractor pins in place I further scraped the disc base to identify the endplates drilled down the posterior annulus and osteophyte complex there was a large disc herniation at this level that a migrated subligamentous this was all removed and aggressive under biting of both endplates decompress central canal marching laterally both C4 pedicles identified and both C4 nerve roots were decompressed.  I then sized up a 6 mm spacer and inserted here.  Then working at C4-5 I flipped the distractor pins and again used upgoing curettes to identify the disc base to drill down the disc base drilled out the large posterior projecting osteophytes coming off C4 and C5 aggressively under Bitton decompressing the central canal remove the posterior large ligament and decompress the central canal further.  Marching laterally both C5 nerve roots were identified and skeletonized and decompressed flush of the pedicle.  I inserted another 6 mm allograft spacer at this level.  We then reposition the retractor reposition the Caspar distracting pins to work at C5-6.  Large anterior osteophytes and was covering up the anterior vertebral body this were  all Bitton away to space was identified the space was drilled down large posterior osteophytes were drilled away posterior large ligament is identified and removed in piecemeal fashion  aggressive interbody both endplates marching laterally remove the uncinate decompressing skeletonized both C6 nerve roots flush with pedicle and inserted a 7 mm allograft spacer at C5-6.  Then distractor pins were placed at C6-7 in similar fashion this to space was drilled down large posteriorly projecting osteophytes were removed aggressively under biting both endplates decompressing central canal and both C7 nerve roots were decompressed and skeletonized flush with pedicle significant spinal cord compression was present at all levels this was all alleviated at the level beyond the pedicles at each foramina.  Then I inserted a 6 mm spacer at C6-7 and incised up and contoured the anterior vertebral bodies removing the osteophytes plate would sit flush selected a seven 5 mm Atlantis translational plate and under fluoroscopy placed all the screws and locking mechanisms.  Postop imaging confirmed good position of everything meticulous hemostasis was maintained the wound was copiously irrigated I then placed a JP drain and closed the wound in layers with interrupted Vicryl and running 4-0 subcuticular Dermabond benzoin Steri-Strips and a sterile dressing was applied patient went to cover him in stable condition.  At the end the case all needle count sponge counts were correct.

## 2024-12-18 NOTE — Anesthesia Preprocedure Evaluation (Addendum)
"                                    Anesthesia Evaluation  Patient identified by MRN, date of birth, ID band Patient awake    Reviewed: Allergy & Precautions, NPO status , Patient's Chart, lab work & pertinent test results  History of Anesthesia Complications Negative for: history of anesthetic complications  Airway Mallampati: I  TM Distance: >3 FB Neck ROM: Limited    Dental  (+) Dental Advisory Given, Teeth Intact   Pulmonary    breath sounds clear to auscultation       Cardiovascular hypertension (Amlodipine ; ARB-HCTZ), Pt. on medications  Rhythm:Regular Rate:Normal     Neuro/Psych    GI/Hepatic negative GI ROS, Neg liver ROS,,,  Endo/Other  neg diabetes    Renal/GU      Musculoskeletal  (+) Arthritis ,    Abdominal   Peds  Hematology  Hgb 15.3, Plts 229K (12/16/24)  Type and Screen active    Anesthesia Other Findings   Reproductive/Obstetrics                              Anesthesia Physical Anesthesia Plan  ASA: 3  Anesthesia Plan: General   Post-op Pain Management:    Induction: Intravenous  PONV Risk Score and Plan: 2 and Ondansetron , Dexamethasone  and Treatment may vary due to age or medical condition  Airway Management Planned: Oral ETT and Video Laryngoscope Planned  Additional Equipment: Arterial line  Intra-op Plan:   Post-operative Plan: Extubation in OR  Informed Consent: I have reviewed the patients History and Physical, chart, labs and discussed the procedure including the risks, benefits and alternatives for the proposed anesthesia with the patient or authorized representative who has indicated his/her understanding and acceptance.     Dental advisory given  Plan Discussed with: CRNA  Anesthesia Plan Comments:          Anesthesia Quick Evaluation  "

## 2024-12-18 NOTE — Transfer of Care (Signed)
 Immediate Anesthesia Transfer of Care Note  Patient: Cristian Buck  Procedure(s) Performed: CERVICAL THREE-FOUR, CERVICAL FOUR-FIVE, CERVICAL FIVE-SIX, CERVICAL SIX-SEVEN ANTERIOR CERVICAL DECOMPRESSION/DISCECTOMY FUSION (Spine Cervical)  Patient Location: PACU  Anesthesia Type:General  Level of Consciousness: awake, alert , and oriented  Airway & Oxygen Therapy: Patient Spontanous Breathing and Patient connected to nasal cannula oxygen  Post-op Assessment: Report given to RN and Post -op Vital signs reviewed and stable  Post vital signs: Reviewed and stable  Last Vitals:  Vitals Value Taken Time  BP 145/93 12/18/24 13:04  Temp 97.8   Pulse 89 12/18/24 13:08  Resp 20 12/18/24 13:08  SpO2 95 % 12/18/24 13:08  Vitals shown include unfiled device data.  Last Pain:  Vitals:   12/18/24 0602  TempSrc:   PainSc: 4          Complications: No notable events documented.

## 2024-12-18 NOTE — Plan of Care (Signed)
 " Problem: Education: Goal: Knowledge of General Education information will improve Description: Including pain rating scale, medication(s)/side effects and non-pharmacologic comfort measures 12/18/2024 1716 by Lum Look, RN Outcome: Progressing 12/18/2024 1716 by Lum Look, RN Outcome: Progressing   Problem: Health Behavior/Discharge Planning: Goal: Ability to manage health-related needs will improve 12/18/2024 1716 by Lum Look, RN Outcome: Progressing 12/18/2024 1716 by Lum Look, RN Outcome: Progressing   Problem: Clinical Measurements: Goal: Ability to maintain clinical measurements within normal limits will improve 12/18/2024 1716 by Lum Look, RN Outcome: Progressing 12/18/2024 1716 by Lum Look, RN Outcome: Progressing Goal: Will remain free from infection 12/18/2024 1716 by Lum Look, RN Outcome: Progressing 12/18/2024 1716 by Lum Look, RN Outcome: Progressing Goal: Diagnostic test results will improve 12/18/2024 1716 by Lum Look, RN Outcome: Progressing 12/18/2024 1716 by Lum Look, RN Outcome: Progressing Goal: Respiratory complications will improve 12/18/2024 1716 by Lum Look, RN Outcome: Progressing 12/18/2024 1716 by Lum Look, RN Outcome: Progressing Goal: Cardiovascular complication will be avoided 12/18/2024 1716 by Lum Look, RN Outcome: Progressing 12/18/2024 1716 by Lum Look, RN Outcome: Progressing   Problem: Activity: Goal: Risk for activity intolerance will decrease 12/18/2024 1716 by Lum Look, RN Outcome: Progressing 12/18/2024 1716 by Lum Look, RN Outcome: Progressing   Problem: Nutrition: Goal: Adequate nutrition will be maintained 12/18/2024 1716 by Lum Look, RN Outcome: Progressing 12/18/2024 1716 by Lum Look, RN Outcome: Progressing   Problem: Coping: Goal: Level of anxiety will decrease 12/18/2024 1716 by Lum Look, RN Outcome:  Progressing 12/18/2024 1716 by Lum Look, RN Outcome: Progressing   Problem: Elimination: Goal: Will not experience complications related to bowel motility 12/18/2024 1716 by Lum Look, RN Outcome: Progressing 12/18/2024 1716 by Lum Look, RN Outcome: Progressing Goal: Will not experience complications related to urinary retention 12/18/2024 1716 by Lum Look, RN Outcome: Progressing 12/18/2024 1716 by Lum Look, RN Outcome: Progressing   Problem: Pain Managment: Goal: General experience of comfort will improve and/or be controlled 12/18/2024 1716 by Lum Look, RN Outcome: Progressing 12/18/2024 1716 by Lum Look, RN Outcome: Progressing   Problem: Safety: Goal: Ability to remain free from injury will improve 12/18/2024 1716 by Lum Look, RN Outcome: Progressing 12/18/2024 1716 by Lum Look, RN Outcome: Progressing   Problem: Skin Integrity: Goal: Risk for impaired skin integrity will decrease 12/18/2024 1716 by Lum Look, RN Outcome: Progressing 12/18/2024 1716 by Lum Look, RN Outcome: Progressing   Problem: Education: Goal: Knowledge of the prescribed therapeutic regimen will improve 12/18/2024 1716 by Lum Look, RN Outcome: Progressing 12/18/2024 1716 by Lum Look, RN Outcome: Progressing   Problem: Bowel/Gastric: Goal: Gastrointestinal status for postoperative course will improve 12/18/2024 1716 by Lum Look, RN Outcome: Progressing 12/18/2024 1716 by Lum Look, RN Outcome: Progressing   Problem: Cardiac: Goal: Ability to maintain an adequate cardiac output 12/18/2024 1716 by Lum Look, RN Outcome: Progressing 12/18/2024 1716 by Lum Look, RN Outcome: Progressing Goal: Will show no evidence of cardiac arrhythmias 12/18/2024 1716 by Lum Look, RN Outcome: Progressing 12/18/2024 1716 by Lum Look, RN Outcome: Progressing   Problem: Nutritional: Goal: Will attain and maintain  optimal nutritional status 12/18/2024 1716 by Lum Look, RN Outcome: Progressing 12/18/2024 1716 by Lum Look, RN Outcome: Progressing   Problem: Neurological: Goal: Will regain or maintain usual level of consciousness 12/18/2024 1716 by Lum Look, RN Outcome: Progressing 12/18/2024 1716 by Lum Look, RN Outcome: Progressing   Problem: Clinical Measurements: Goal: Ability to maintain clinical measurements within normal limits 12/18/2024 1716 by Lum Look, RN Outcome: Progressing 12/18/2024  1716 by Lum Look, RN Outcome: Progressing Goal: Postoperative complications will be avoided or minimized 12/18/2024 1716 by Lum Look, RN Outcome: Progressing 12/18/2024 1716 by Lum Look, RN Outcome: Progressing   Problem: Respiratory: Goal: Will regain and/or maintain adequate ventilation 12/18/2024 1716 by Lum Look, RN Outcome: Progressing 12/18/2024 1716 by Lum Look, RN Outcome: Progressing Goal: Respiratory status will improve 12/18/2024 1716 by Lum Look, RN Outcome: Progressing 12/18/2024 1716 by Lum Look, RN Outcome: Progressing   Problem: Skin Integrity: Goal: Demonstrates signs of wound healing without infection 12/18/2024 1716 by Lum Look, RN Outcome: Progressing 12/18/2024 1716 by Lum Look, RN Outcome: Progressing   Problem: Urinary Elimination: Goal: Will remain free from infection 12/18/2024 1716 by Lum Look, RN Outcome: Progressing 12/18/2024 1716 by Lum Look, RN Outcome: Progressing Goal: Ability to achieve and maintain adequate urine output 12/18/2024 1716 by Lum Look, RN Outcome: Progressing 12/18/2024 1716 by Lum Look, RN Outcome: Progressing   Problem: Education: Goal: Ability to verbalize activity precautions or restrictions will improve 12/18/2024 1716 by Lum Look, RN Outcome: Progressing 12/18/2024 1716 by Lum Look, RN Outcome: Progressing Goal: Knowledge  of the prescribed therapeutic regimen will improve 12/18/2024 1716 by Lum Look, RN Outcome: Progressing 12/18/2024 1716 by Lum Look, RN Outcome: Progressing Goal: Understanding of discharge needs will improve 12/18/2024 1716 by Lum Look, RN Outcome: Progressing 12/18/2024 1716 by Lum Look, RN Outcome: Progressing   Problem: Activity: Goal: Ability to avoid complications of mobility impairment will improve 12/18/2024 1716 by Lum Look, RN Outcome: Progressing 12/18/2024 1716 by Lum Look, RN Outcome: Progressing Goal: Ability to tolerate increased activity will improve Outcome: Progressing Goal: Will remain free from falls Outcome: Progressing   Problem: Bowel/Gastric: Goal: Gastrointestinal status for postoperative course will improve Outcome: Progressing   Problem: Clinical Measurements: Goal: Ability to maintain clinical measurements within normal limits will improve Outcome: Progressing Goal: Postoperative complications will be avoided or minimized Outcome: Progressing Goal: Diagnostic test results will improve Outcome: Progressing   Problem: Pain Management: Goal: Pain level will decrease Outcome: Progressing   Problem: Skin Integrity: Goal: Will show signs of wound healing Outcome: Progressing   Problem: Health Behavior/Discharge Planning: Goal: Identification of resources available to assist in meeting health care needs will improve Outcome: Progressing   Problem: Bladder/Genitourinary: Goal: Urinary functional status for postoperative course will improve Outcome: Progressing   "

## 2024-12-18 NOTE — Anesthesia Procedure Notes (Signed)
 Arterial Line Insertion Start/End2/04/2025 7:00 AM, 12/18/2024 7:10 AM Performed by: Dene Lauraine DASEN, MD, Scherrie Mast, CRNA, CRNA  Patient location: Pre-op. Preanesthetic checklist: patient identified, IV checked, site marked, risks and benefits discussed, surgical consent, monitors and equipment checked, pre-op evaluation, timeout performed and anesthesia consent Lidocaine  1% used for infiltration radial was placed Catheter size: 20 G Hand hygiene performed , maximum sterile barriers used  and Seldinger technique used Allen's test indicative of satisfactory collateral circulation Attempts: 1 Following insertion, dressing applied. Post procedure assessment: normal and unchanged  Patient tolerated the procedure well with no immediate complications.

## 2024-12-18 NOTE — Anesthesia Procedure Notes (Signed)
 Procedure Name: Intubation Date/Time: 12/18/2024 8:00 AM  Performed by: Scherrie Mast, CRNAPre-anesthesia Checklist: Patient identified, Emergency Drugs available, Suction available and Patient being monitored Patient Re-evaluated:Patient Re-evaluated prior to induction Oxygen Delivery Method: Circle System Utilized Preoxygenation: Pre-oxygenation with 100% oxygen Induction Type: IV induction Ventilation: Mask ventilation without difficulty Laryngoscope Size: Mac and 4 Grade View: Grade I Tube type: Oral Tube size: 7.0 mm Number of attempts: 1 Airway Equipment and Method: Stylet and Oral airway Placement Confirmation: ETT inserted through vocal cords under direct vision, positive ETCO2 and breath sounds checked- equal and bilateral Secured at: 22 cm Tube secured with: Tape Dental Injury: Teeth and Oropharynx as per pre-operative assessment
